# Patient Record
Sex: Female | Born: 1985 | Race: White | Hispanic: No | Marital: Married | Smoking: Never smoker
Health system: Southern US, Community
[De-identification: ages and names within clinical notes are randomized; demographics above are authoritative.]

## PROBLEM LIST (undated history)

## (undated) ENCOUNTER — Inpatient Hospital Stay (HOSPITAL_COMMUNITY): Payer: Self-pay

## (undated) DIAGNOSIS — F419 Anxiety disorder, unspecified: Secondary | ICD-10-CM

## (undated) DIAGNOSIS — F329 Major depressive disorder, single episode, unspecified: Secondary | ICD-10-CM

## (undated) DIAGNOSIS — H9192 Unspecified hearing loss, left ear: Secondary | ICD-10-CM

## (undated) DIAGNOSIS — I10 Essential (primary) hypertension: Secondary | ICD-10-CM

## (undated) DIAGNOSIS — M549 Dorsalgia, unspecified: Secondary | ICD-10-CM

## (undated) DIAGNOSIS — A630 Anogenital (venereal) warts: Secondary | ICD-10-CM

## (undated) DIAGNOSIS — R87629 Unspecified abnormal cytological findings in specimens from vagina: Secondary | ICD-10-CM

## (undated) DIAGNOSIS — F32A Depression, unspecified: Secondary | ICD-10-CM

---
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
## 2004-05-12 ENCOUNTER — Other Ambulatory Visit: Admission: RE | Admit: 2004-05-12 | Discharge: 2004-05-12 | Payer: Self-pay | Admitting: Obstetrics & Gynecology

## 2004-11-06 ENCOUNTER — Ambulatory Visit (HOSPITAL_BASED_OUTPATIENT_CLINIC_OR_DEPARTMENT_OTHER): Admission: RE | Admit: 2004-11-06 | Discharge: 2004-11-06 | Payer: Self-pay | Admitting: Plastic Surgery

## 2004-11-06 ENCOUNTER — Ambulatory Visit (HOSPITAL_COMMUNITY): Admission: RE | Admit: 2004-11-06 | Discharge: 2004-11-06 | Payer: Self-pay | Admitting: Plastic Surgery

## 2006-07-13 ENCOUNTER — Inpatient Hospital Stay (HOSPITAL_COMMUNITY): Admission: AD | Admit: 2006-07-13 | Discharge: 2006-07-17 | Payer: Self-pay | Admitting: Obstetrics & Gynecology

## 2010-10-06 ENCOUNTER — Other Ambulatory Visit: Payer: Self-pay | Admitting: Obstetrics and Gynecology

## 2011-09-20 ENCOUNTER — Ambulatory Visit (HOSPITAL_COMMUNITY)
Admission: RE | Admit: 2011-09-20 | Discharge: 2011-09-20 | Disposition: A | Discharge status: Home / Self Care | Payer: Managed Care, Other (non HMO) | Source: Ambulatory Visit | Attending: Family Medicine | Admitting: Family Medicine

## 2011-09-20 ENCOUNTER — Other Ambulatory Visit: Payer: Self-pay | Admitting: Family Medicine

## 2011-09-20 DIAGNOSIS — M549 Dorsalgia, unspecified: Secondary | ICD-10-CM

## 2011-09-20 DIAGNOSIS — M546 Pain in thoracic spine: Secondary | ICD-10-CM | POA: Insufficient documentation

## 2011-09-20 DIAGNOSIS — M542 Cervicalgia: Secondary | ICD-10-CM | POA: Insufficient documentation

## 2011-10-12 ENCOUNTER — Encounter (HOSPITAL_BASED_OUTPATIENT_CLINIC_OR_DEPARTMENT_OTHER): Payer: Self-pay | Admitting: *Deleted

## 2011-10-12 ENCOUNTER — Emergency Department (INDEPENDENT_AMBULATORY_CARE_PROVIDER_SITE_OTHER): Payer: Worker's Compensation

## 2011-10-12 ENCOUNTER — Emergency Department (HOSPITAL_BASED_OUTPATIENT_CLINIC_OR_DEPARTMENT_OTHER)
Admission: EM | Admit: 2011-10-12 | Discharge: 2011-10-12 | Disposition: A | Discharge status: Home / Self Care | Payer: Worker's Compensation | Attending: Emergency Medicine | Admitting: Emergency Medicine

## 2011-10-12 DIAGNOSIS — W3189XA Contact with other specified machinery, initial encounter: Secondary | ICD-10-CM | POA: Insufficient documentation

## 2011-10-12 DIAGNOSIS — S59909A Unspecified injury of unspecified elbow, initial encounter: Secondary | ICD-10-CM

## 2011-10-12 DIAGNOSIS — M25539 Pain in unspecified wrist: Secondary | ICD-10-CM

## 2011-10-12 DIAGNOSIS — S5780XA Crushing injury of unspecified forearm, initial encounter: Secondary | ICD-10-CM | POA: Insufficient documentation

## 2011-10-12 DIAGNOSIS — M79609 Pain in unspecified limb: Secondary | ICD-10-CM

## 2011-10-12 DIAGNOSIS — Q851 Tuberous sclerosis: Secondary | ICD-10-CM | POA: Insufficient documentation

## 2011-10-12 DIAGNOSIS — F17200 Nicotine dependence, unspecified, uncomplicated: Secondary | ICD-10-CM | POA: Insufficient documentation

## 2011-10-12 DIAGNOSIS — X58XXXA Exposure to other specified factors, initial encounter: Secondary | ICD-10-CM

## 2011-10-12 DIAGNOSIS — W230XXA Caught, crushed, jammed, or pinched between moving objects, initial encounter: Secondary | ICD-10-CM

## 2011-10-12 DIAGNOSIS — S6990XA Unspecified injury of unspecified wrist, hand and finger(s), initial encounter: Secondary | ICD-10-CM

## 2011-10-12 DIAGNOSIS — Y9289 Other specified places as the place of occurrence of the external cause: Secondary | ICD-10-CM | POA: Insufficient documentation

## 2011-10-12 DIAGNOSIS — Y990 Civilian activity done for income or pay: Secondary | ICD-10-CM | POA: Insufficient documentation

## 2011-10-12 HISTORY — DX: Dorsalgia, unspecified: M54.9

## 2011-10-12 MED ORDER — TRAMADOL HCL 50 MG PO TABS: 50.0000 mg | ORAL_TABLET | Freq: Four times a day (QID) | ORAL | Status: AC | PRN

## 2011-10-12 NOTE — ED Notes (Signed)
Pt reports her right arm got caught in packing machine at work and "started to be crushed"- pulse present, cap refill <3 sec

## 2011-10-12 NOTE — Discharge Instructions (Signed)
Crush Injury, Fingers or Toes  A crush injury to the fingers or toes means the tissues have been damaged by being squeezed (compressed). There will be bleeding into the tissues and swelling. Often, blood will collect under the skin. When this happens, the skin on the finger often dies and may slough off (shed) 1 week to 10 days later. Usually, new skin is growing underneath. If the injury has been too severe and the tissue does not survive, the damaged tissue may begin to turn black over several days.   Wounds which occur because of the crushing may be stitched (sutured) shut. However, crush injuries are more likely to become infected than other injuries.These wounds may not be closed as tightly as other types of cuts to prevent infection. Nails involved are often lost. These usually grow back over several weeks.   DIAGNOSIS  X-rays may be taken to see if there is any injury to the bones.  TREATMENT  Broken bones (fractures) may be treated with splinting, depending on the fracture. Often, no treatment is required for fractures of the last bone in the fingers or toes.  HOME CARE INSTRUCTIONS    The crushed part should be raised (elevated) above the heart or center of the chest as much as possible for the first several days or as directed. This helps with pain and lessens swelling. Less swelling increases the chances that the crushed part will survive.   Put ice on the injured area.   Put ice in a plastic bag.   Place a towel between your skin and the bag.   Leave the ice on for 15 to 20 minutes, 3 to 4 times a day for the first 2 days.   Only take over-the-counter or prescription medicines for pain, discomfort, or fever as directed by your caregiver.   Use your injured part only as directed.   Change your bandages (dressings) as directed.   Keep all follow-up appointments as directed by your caregiver. Not keeping your appointment could result in a chronic or permanent injury, pain, and disability. If there  is any problem keeping the appointment, you must call to reschedule.  SEEK IMMEDIATE MEDICAL CARE IF:    There is redness, swelling, or increasing pain in the wound area.   Pus is coming from the wound.   You have a fever.   You notice a bad smell coming from the wound or dressing.   The edges of the wound do not stay together after the sutures have been removed.   You are unable to move the injured finger or toe.  MAKE SURE YOU:    Understand these instructions.   Will watch your condition.   Will get help right away if you are not doing well or get worse.  Document Released: 05/17/2005 Document Revised: 05/06/2011 Document Reviewed: 10/02/2010  [employer] Patient Information 2012 [employer].

## 2011-10-12 NOTE — ED Provider Notes (Signed)
History    This chart was scribed for Zoe Bertha Zoe Cords, MD, MD by Zoe Evans. The patient was seen in room MH01 and the patient's care was started at 12:48AM.   CSN: [id number]  Arrival date & time 10/12/11  0014   First MD Initiated Contact with Patient 10/12/11 0047      Chief Complaint  Patient presents with  . Arm Injury    (Consider location/radiation/quality/duration/timing/severity/associated sxs/prior treatment) Patient is a 26 y.o. female presenting with arm injury. The history is provided by the patient.  Arm Injury  The incident occurred today. The incident occurred at work. The injury mechanism was a crush injury. The injury was related to a product (machine partially cam down on right forearm). The wounds were not self-inflicted. No protective equipment was used. She came to the ER via personal transport. There is an injury to the right forearm. The pain is moderate. It is unlikely that a foreign body is present. Pertinent negatives include no focal weakness and no tingling. There have been no prior injuries to these areas. Her tetanus status is UTD. She has received no recent medical care.   Zoe Evans is a 26 y.o. female who presents to the Emergency Department complaining of moderate right arm injury onset today. Pt was at work and got her arm caught in packing machine. She states that machine "started crushing her arm." Her tetanus is UTD. She denies any other pain. Pain has been constant since onset without radiation.   Past Medical History  Diagnosis Date  . Back pain     Past Surgical History  Procedure Date  . Cesarean section     No family history on file.  History  Substance Use Topics  . Smoking status: Current Everyday Smoker  . Smokeless tobacco: Never Used  . Alcohol Use: Not on file    OB History    Grav Para Term Preterm Abortions TAB SAB Ect Mult Living                  Review of Systems  Neurological: Negative for tingling and  focal weakness.  All other systems reviewed and are negative.   10 Systems reviewed and all are negative for acute change except as noted in the HPI.   Allergies  Codeine  Home Medications   Current Outpatient Rx  Name Route Sig Dispense Refill  . CYCLOBENZAPRINE HCL 5 MG PO TABS Oral Take 5 mg by mouth 3 (three) times daily as needed.    Marland Kitchen HYDROCODONE-ACETAMINOPHEN 5-325 MG PO TABS Oral Take 1 tablet by mouth every 6 (six) hours as needed.      BP 132/94  Pulse 79  Temp(Src) 98.6 F (37 C) (Oral)  Resp 20  Ht 5\' 4"  (1.626 m)  Wt 160 lb (72.576 kg)  BMI 27.46 kg/m2  SpO2 100%  Physical Exam  Nursing note and vitals reviewed. Constitutional: She is oriented to person, place, and time. She appears well-developed and well-nourished. No distress.  HENT:  Head: Normocephalic and atraumatic.  Eyes: EOM are normal.  Cardiovascular: Intact distal pulses.        Cap refill less than 2 sec to all digits  Musculoskeletal: Normal range of motion.       denuded area 1.25 cm in wrist   Neurological: She is alert and oriented to person, place, and time. She has normal reflexes.  Skin: Skin is warm and dry.  Psychiatric: She has a normal mood and affect. Her  behavior is normal.    ED Course  Procedures (including critical care time) DIAGNOSTIC STUDIES: Oxygen Saturation is 100% on room air, normal by my interpretation.    COORDINATION OF CARE: 12:50PM EDP discusses pt ED treatment with pt. Right Wrist and forearm xray   Labs Reviewed - No data to display No results found.   No diagnosis found.    MDM  Wound care with neosporin, keep clean and dry.  follow up with hand surgeon within 2 days return for redness streaking drainage worsening swelling of the extremity discoloration.  Loss of sensation or any concerns.  Patient verbalizes understanding and agrees to follow up      Zoe Debarr Zoe Cords, MD 10/12/11 0401

## 2012-02-03 ENCOUNTER — Emergency Department (HOSPITAL_BASED_OUTPATIENT_CLINIC_OR_DEPARTMENT_OTHER)
Admission: EM | Admit: 2012-02-03 | Discharge: 2012-02-03 | Disposition: A | Discharge status: Home / Self Care | Payer: Worker's Compensation | Attending: Emergency Medicine | Admitting: Emergency Medicine

## 2012-02-03 ENCOUNTER — Encounter (HOSPITAL_BASED_OUTPATIENT_CLINIC_OR_DEPARTMENT_OTHER): Payer: Self-pay

## 2012-02-03 DIAGNOSIS — Z886 Allergy status to analgesic agent status: Secondary | ICD-10-CM | POA: Insufficient documentation

## 2012-02-03 DIAGNOSIS — F17200 Nicotine dependence, unspecified, uncomplicated: Secondary | ICD-10-CM | POA: Insufficient documentation

## 2012-02-03 DIAGNOSIS — S39012A Strain of muscle, fascia and tendon of lower back, initial encounter: Secondary | ICD-10-CM

## 2012-02-03 DIAGNOSIS — S239XXA Sprain of unspecified parts of thorax, initial encounter: Secondary | ICD-10-CM | POA: Insufficient documentation

## 2012-02-03 DIAGNOSIS — X503XXA Overexertion from repetitive movements, initial encounter: Secondary | ICD-10-CM | POA: Insufficient documentation

## 2012-02-03 MED ORDER — TRAMADOL HCL 50 MG PO TABS: 50.0000 mg | ORAL_TABLET | Freq: Four times a day (QID) | ORAL | Status: AC | PRN

## 2012-02-03 NOTE — ED Provider Notes (Signed)
History     CSN: [id number]  Arrival date & time 02/03/12  0350   First MD Initiated Contact with Patient 02/03/12 0424      Chief Complaint  Patient presents with  . back and right arm pain     (Consider location/radiation/quality/duration/timing/severity/associated sxs/prior treatment) HPI Comments: Patient noticed that she was having pain in her upper back after lifting heavy objects at work.  There was no trauma or fall.  She says her right arm is painful as well.  There is no weakness and no numbness.  She denies chest pain or shortness of breath.  The history is provided by the patient.    Past Medical History  Diagnosis Date  . Back pain     Past Surgical History  Procedure Date  . Cesarean section     No family history on file.  History  Substance Use Topics  . Smoking status: Current Everyday Smoker -- 0.5 packs/day  . Smokeless tobacco: Never Used  . Alcohol Use: 0.6 oz/week    1 Glasses of wine per week     occasional    OB History    Grav Para Term Preterm Abortions TAB SAB Ect Mult Living                  Review of Systems  All other systems reviewed and are negative.    Allergies  Codeine  Home Medications  No current outpatient prescriptions on file.  BP 127/80  Pulse 78  Temp 98 F (36.7 C) (Oral)  Resp 18  SpO2 100%  Physical Exam  Nursing note and vitals reviewed. Constitutional: She is oriented to person, place, and time. She appears well-developed and well-nourished.  HENT:  Head: Normocephalic and atraumatic.  Neck: Normal range of motion. Neck supple.  Cardiovascular: Normal rate and regular rhythm.   Pulmonary/Chest: Effort normal and breath sounds normal. No respiratory distress.  Musculoskeletal:       The right arm appears grossly normal.  There is ttp in the right upper back.  Strength is 5/5 in bue, ulnar and radial pulses are palpable and equal in the bue.    Neurological: She is alert and oriented to person,  place, and time.  Skin: Skin is warm and dry.    ED Course  Procedures (including critical care time)  Labs Reviewed - No data to display No results found.   No diagnosis found.    MDM  Will treat as back strain with nsaids, tramadol.        Geoffery Lyons, MD 02/03/12 [phone]

## 2012-02-03 NOTE — ED Notes (Signed)
Patient here from work after reporting that she lifted approximately 40lbs and now is experiencing general backpain and pain down right arm with any movement. No obvious trauma noted

## 2012-02-03 NOTE — ED Notes (Signed)
MD at bedside. 

## 2013-06-26 ENCOUNTER — Encounter: Payer: Self-pay | Admitting: Nurse Practitioner

## 2013-06-26 ENCOUNTER — Ambulatory Visit (INDEPENDENT_AMBULATORY_CARE_PROVIDER_SITE_OTHER): Payer: Medicaid Other | Admitting: Nurse Practitioner

## 2013-06-26 VITALS — BP 130/88 | Temp 98.4°F | Ht 63.0 in | Wt 176.1 lb

## 2013-06-26 DIAGNOSIS — F341 Dysthymic disorder: Secondary | ICD-10-CM

## 2013-06-26 DIAGNOSIS — F418 Other specified anxiety disorders: Secondary | ICD-10-CM

## 2013-06-26 DIAGNOSIS — R21 Rash and other nonspecific skin eruption: Secondary | ICD-10-CM

## 2013-06-26 MED ORDER — CITALOPRAM HYDROBROMIDE 20 MG PO TABS: 20.0000 mg | ORAL_TABLET | Freq: Every day | ORAL | Status: DC

## 2013-06-26 MED ORDER — CLOBETASOL PROPIONATE 0.05 % EX CREA: 1.0000 "application " | TOPICAL_CREAM | Freq: Two times a day (BID) | CUTANEOUS | Status: DC

## 2013-06-26 NOTE — Patient Instructions (Signed)
Loratadine 10 mg in the morning Benadryl 25 mg at night 

## 2013-07-01 ENCOUNTER — Encounter: Payer: Self-pay | Admitting: Nurse Practitioner

## 2013-07-01 DIAGNOSIS — F418 Other specified anxiety disorders: Secondary | ICD-10-CM | POA: Insufficient documentation

## 2013-07-01 NOTE — Assessment & Plan Note (Signed)
Restart Celexa as directed. Discussed importance of stress reduction and regular exercise. Recheck in 3 months.

## 2013-07-01 NOTE — Progress Notes (Signed)
Subjective:  Presents with complaints of a rash on the front of both legs the past 3 months. Limited to this area. No known contacts. Extremely pruritic to the point of scratching until she believes. Would like to restart her Celexa. Took this without difficulty, has been off of it for about a year. Has been under a lot more stress. Has decreased her exercise. Complaints of fatigue, emotional lability, sleep disturbance. No suicidal thoughts or ideation.  Objective:   BP 130/88  Temp(Src) 98.4 F (36.9 C) (Oral)  Ht 5\' 3"  (1.6 m)  Wt 176 lb 2 oz (79.89 kg)  BMI 31.21 kg/m2 NAD. Alert, oriented. Lungs clear. Heart regular rate rhythm. Very faint pink fine papular lesions noted on both pretibial areas. No open areas. No other rash.  Assessment: Depression with anxiety  Rash and nonspecific skin eruption  Plan: Meds ordered this encounter  Medications  . clobetasol cream (TEMOVATE) 0.05 %    Sig: Apply 1 application topically 2 (two) times daily. Prn rash up to 2 weeks    Dispense:  45 g    Refill:  0    Order Specific Question:  Supervising Provider    Answer:  Merlyn AlbertLUKING, WILLIAM S [[id number]]  . citalopram (CELEXA) 20 MG tablet    Sig: Take 1 tablet (20 mg total) by mouth daily.    Dispense:  30 tablet    Refill:  5    Order Specific Question:  Supervising Provider    Answer:  Merlyn AlbertLUKING, WILLIAM S [[id number]]   antihistamines as directed for itching. Discussed importance of stress reduction and regular exercise. Recheck in 3 months, call back sooner if rash persists.

## 2013-10-15 ENCOUNTER — Encounter: Payer: Self-pay | Admitting: Family Medicine

## 2013-10-15 ENCOUNTER — Ambulatory Visit (INDEPENDENT_AMBULATORY_CARE_PROVIDER_SITE_OTHER): Payer: Medicaid Other | Admitting: Family Medicine

## 2013-10-15 VITALS — BP 118/74 | Temp 98.3°F | Ht 63.0 in | Wt 168.0 lb

## 2013-10-15 DIAGNOSIS — J310 Chronic rhinitis: Secondary | ICD-10-CM

## 2013-10-15 DIAGNOSIS — J329 Chronic sinusitis, unspecified: Secondary | ICD-10-CM

## 2013-10-15 MED ORDER — CEFPROZIL 500 MG PO TABS: 500.0000 mg | ORAL_TABLET | Freq: Two times a day (BID) | ORAL | Status: DC

## 2013-10-15 NOTE — Progress Notes (Signed)
   Subjective:    Patient ID: Zoe Evans, female    DOB: 03/06/1986, 28 y.o.   MRN: [id number]  Sinusitis This is a new problem. The current episode started in the past 7 days. There has been no fever. Associated symptoms include congestion, coughing, headaches, sinus pressure and a sore throat. Past treatments include acetaminophen and oral decongestants. The treatment provided no relief.   Hit about ten d ago,swelling in the face  Nasal cong and dissch  Cough prod at times  Ears hurting   nor fever  fa had sinusitis   Lot a cough a titimes, no hx wheezing  Review of Systems  HENT: Positive for congestion, sinus pressure and sore throat.   Respiratory: Positive for cough.   Neurological: Positive for headaches.       Objective:   Physical Exam  Alert mild malaise. Frontal tenderness. Trace erythematous . Lungs clear heart regular in rhythm.      Assessment & Plan:  Impression acute rhinosinusitis plan antibiotics prescribed. Encouraged to stop smoking. Symptomatic care discussed. 1 signs discussed. WSL

## 2013-11-02 ENCOUNTER — Telehealth: Payer: Self-pay | Admitting: Family Medicine

## 2013-11-02 MED ORDER — AMOXICILLIN-POT CLAVULANATE 875-125 MG PO TABS: 1.0000 | ORAL_TABLET | Freq: Two times a day (BID) | ORAL | Status: AC

## 2013-11-02 MED ORDER — FLUCONAZOLE 150 MG PO TABS: ORAL_TABLET | ORAL | Status: DC

## 2013-11-02 NOTE — Telephone Encounter (Signed)
difl 150 two one po thr d apart

## 2013-11-02 NOTE — Telephone Encounter (Signed)
Patient was just seen on 10/15/2013 for rhinosinusitis. She is still having a lot of pressure in her nasal area and a lot of drainage. She also has a really bad cough and a sore throat from the drainage. Can we try her on something else?  [facility]

## 2013-11-02 NOTE — Telephone Encounter (Signed)
Medication sent to pharmacy. Patient was notified.  

## 2013-11-02 NOTE — Telephone Encounter (Signed)
Left message on voicemail notifying patient that med was sent to pharmacy.  

## 2013-11-02 NOTE — Telephone Encounter (Signed)
Patient would like something for a yeast infection called in to [facility].

## 2013-11-02 NOTE — Telephone Encounter (Signed)
Aug 875 bid ten d 

## 2013-11-02 NOTE — Telephone Encounter (Signed)
Patient seen on 10/15/13 and prescribed Cefzil 500 mg BID 10 days

## 2013-12-10 ENCOUNTER — Encounter: Payer: Self-pay | Admitting: Family Medicine

## 2013-12-10 ENCOUNTER — Encounter: Payer: Self-pay | Admitting: Nurse Practitioner

## 2013-12-10 ENCOUNTER — Ambulatory Visit (INDEPENDENT_AMBULATORY_CARE_PROVIDER_SITE_OTHER): Payer: Medicaid Other | Admitting: Nurse Practitioner

## 2013-12-10 VITALS — BP 112/72 | Temp 98.4°F | Ht 63.0 in | Wt 168.1 lb

## 2013-12-10 DIAGNOSIS — S90862A Insect bite (nonvenomous), left foot, initial encounter: Secondary | ICD-10-CM

## 2013-12-10 DIAGNOSIS — IMO0002 Reserved for concepts with insufficient information to code with codable children: Secondary | ICD-10-CM

## 2013-12-10 DIAGNOSIS — W57XXXA Bitten or stung by nonvenomous insect and other nonvenomous arthropods, initial encounter: Secondary | ICD-10-CM

## 2013-12-10 MED ORDER — DOXYCYCLINE HYCLATE 100 MG PO TABS: 100.0000 mg | ORAL_TABLET | Freq: Two times a day (BID) | ORAL | Status: DC

## 2013-12-10 MED ORDER — CLOBETASOL PROPIONATE 0.05 % EX CREA: 1.0000 "application " | TOPICAL_CREAM | Freq: Two times a day (BID) | CUTANEOUS | Status: DC

## 2013-12-10 NOTE — Patient Instructions (Signed)
Loratadine 10 mg in the morning Benadryl 25 mg at night 

## 2013-12-12 ENCOUNTER — Encounter: Payer: Self-pay | Admitting: Nurse Practitioner

## 2013-12-12 NOTE — Progress Notes (Signed)
Subjective:  Presents for c/o a tick bite between toes on left foot that began 2 days ago. Localized mild edema, itching and tenderness. No fever. No headache. No other rash.   Objective:   BP 112/72  Temp(Src) 98.4 F (36.9 C) (Oral)  Ht 5\' 3"  (1.6 m)  Wt 168 lb 2 oz (76.261 kg)  BMI 29.79 kg/m2 NAD. Alert, oriented. Small raised pink papules between toes on left foot. Mild edema and tenderness. Strong DP pulse, toes warm with good cap refill.  Assessment: Tick bite of left foot, initial encounter   Plan:  Meds ordered this encounter  Medications  . doxycycline (VIBRA-TABS) 100 MG tablet    Sig: Take 1 tablet (100 mg total) by mouth 2 (two) times daily.    Dispense:  14 tablet    Refill:  0    Order Specific Question:  Supervising Provider    Answer:  Merlyn AlbertLUKING, WILLIAM S [[id number]]  . clobetasol cream (TEMOVATE) 0.05 %    Sig: Apply 1 application topically 2 (two) times daily.    Dispense:  30 g    Refill:  0    Order Specific Question:  Supervising Provider    Answer:  Merlyn AlbertLUKING, WILLIAM S [[id number]]   Reviewed signs of tick fever and infection. OTC antihistamine as directed. Return if symptoms worsen or fail to improve.

## 2013-12-18 ENCOUNTER — Other Ambulatory Visit: Payer: Self-pay | Admitting: Obstetrics and Gynecology

## 2013-12-19 LAB — CYTOLOGY - PAP

## 2014-01-21 ENCOUNTER — Ambulatory Visit: Payer: Medicaid Other | Admitting: Nurse Practitioner

## 2014-06-13 ENCOUNTER — Ambulatory Visit: Payer: Self-pay

## 2014-06-13 ENCOUNTER — Other Ambulatory Visit: Payer: Self-pay | Admitting: Occupational Medicine

## 2014-06-13 DIAGNOSIS — M25571 Pain in right ankle and joints of right foot: Secondary | ICD-10-CM

## 2014-07-24 ENCOUNTER — Encounter: Payer: Self-pay | Admitting: Family Medicine

## 2014-07-24 ENCOUNTER — Ambulatory Visit (INDEPENDENT_AMBULATORY_CARE_PROVIDER_SITE_OTHER): Payer: Self-pay | Admitting: Family Medicine

## 2014-07-24 VITALS — BP 138/98 | Ht 63.0 in | Wt 182.0 lb

## 2014-07-24 DIAGNOSIS — H8110 Benign paroxysmal vertigo, unspecified ear: Secondary | ICD-10-CM

## 2014-07-24 DIAGNOSIS — J329 Chronic sinusitis, unspecified: Secondary | ICD-10-CM

## 2014-07-24 DIAGNOSIS — J310 Chronic rhinitis: Secondary | ICD-10-CM

## 2014-07-24 DIAGNOSIS — F418 Other specified anxiety disorders: Secondary | ICD-10-CM

## 2014-07-24 MED ORDER — CITALOPRAM HYDROBROMIDE 20 MG PO TABS: 20.0000 mg | ORAL_TABLET | Freq: Every day | ORAL | Status: DC

## 2014-07-24 MED ORDER — AMOXICILLIN-POT CLAVULANATE 875-125 MG PO TABS: 1.0000 | ORAL_TABLET | Freq: Two times a day (BID) | ORAL | Status: DC

## 2014-07-24 MED ORDER — MECLIZINE HCL 25 MG PO TABS: 25.0000 mg | ORAL_TABLET | Freq: Three times a day (TID) | ORAL | Status: DC | PRN

## 2014-07-24 NOTE — Progress Notes (Signed)
   Subjective:    Patient ID: Zoe Evans, female    DOB: 10/20/1985, 29 y.o.   MRN: [id number]  HPI Patient is here today for a med check.  She needs a refill on her Celexa.takes it both for anxiety and depression. Mom's side has more trouble with thius states overall Celexa definitely helping  Pt also c/o dizziness that comes and goes. She said she has sinus infections all the time, and she knows that it is related to that. She does currently have sinus pressure, nasal congestion, with a small amount of bloody tinged mucous. Eyes have trouble focusing and starts to spin. Nausea with it. 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 Lungs clear. Heart rare in rhythm. Neuro exam intact     Assessment & Plan:  Impression 1 rhinosinusitis discussed #2 intermittent vertigo discussed #3 depression stable #4 elevated blood pressure times just one family history positive plan diet exercise discussed. Celexa refilled. Antibiotics prescribed. Antivert use discussed. WSL

## 2014-08-09 ENCOUNTER — Ambulatory Visit (INDEPENDENT_AMBULATORY_CARE_PROVIDER_SITE_OTHER): Payer: Self-pay | Admitting: Family Medicine

## 2014-08-09 ENCOUNTER — Encounter: Payer: Self-pay | Admitting: Family Medicine

## 2014-08-09 VITALS — BP 124/86 | Temp 98.4°F | Ht 63.0 in | Wt 180.2 lb

## 2014-08-09 DIAGNOSIS — G43909 Migraine, unspecified, not intractable, without status migrainosus: Secondary | ICD-10-CM | POA: Insufficient documentation

## 2014-08-09 DIAGNOSIS — G43709 Chronic migraine without aura, not intractable, without status migrainosus: Secondary | ICD-10-CM

## 2014-08-09 MED ORDER — SUMATRIPTAN SUCCINATE 50 MG PO TABS: ORAL_TABLET | ORAL | Status: DC

## 2014-08-09 MED ORDER — HYDROCODONE-ACETAMINOPHEN 5-325 MG PO TABS: 1.0000 | ORAL_TABLET | Freq: Four times a day (QID) | ORAL | Status: DC | PRN

## 2014-08-09 MED ORDER — PROMETHAZINE HCL 25 MG PO TABS: 25.0000 mg | ORAL_TABLET | Freq: Four times a day (QID) | ORAL | Status: DC | PRN

## 2014-08-09 NOTE — Progress Notes (Signed)
   Subjective:    Patient ID: Zoe Evans, female    DOB: 05/01/1986, 29 y.o.   MRN: [id number]  Migraine  This is a new problem. The current episode started in the past 7 days. The problem occurs constantly. The problem has been unchanged. The pain radiates to the face, right neck, left neck, left shoulder and right shoulder. The quality of the pain is described as aching. The pain is at a severity of 10/10. The pain is moderate. Associated symptoms include muscle aches, nausea and vomiting. Associated symptoms comments: Fatigue . Nothing aggravates the symptoms. She has tried oral narcotics for the symptoms. The treatment provided moderate relief.   Patient states that she has no other concerns at this time  Headaches in general at least two times per wk  Clean houss no sig problem with this  infxn better  Right sided pain and tenderness, vomited  Now notes nexck pain  No merena present  Took pain meds with boyfriends hydrocodone  Has had more lately    .    Review of Systems  Gastrointestinal: Positive for nausea and vomiting.   no chest pain no abdominal pain no fever no chills     Objective:   Physical Exam Alert vitals stable. Lungs clear. Heart regular rhythm H&T normal neuro exam intact       Assessment & Plan:  Impression migraine headaches discussed plan Imitrex when necessary. Ibuprofen when necessary. Phenergan for nausea. Hydrocodone use sparingly no refill of USL

## 2015-01-13 ENCOUNTER — Telehealth: Payer: Self-pay | Admitting: Family Medicine

## 2015-01-13 DIAGNOSIS — Z30431 Encounter for routine checking of intrauterine contraceptive device: Secondary | ICD-10-CM

## 2015-01-13 NOTE — Telephone Encounter (Signed)
Patient needs referral to [facility] because she has Medicaid [employer].  She needs to see them because she has a Mirena and she feels like it is acting up.

## 2015-01-13 NOTE — Telephone Encounter (Signed)
Let's do 

## 2015-01-13 NOTE — Telephone Encounter (Signed)
Notified patient that referral has been initiated.

## 2015-03-05 ENCOUNTER — Telehealth: Payer: Self-pay | Admitting: Family Medicine

## 2015-03-05 ENCOUNTER — Other Ambulatory Visit: Payer: Self-pay | Admitting: *Deleted

## 2015-03-05 MED ORDER — CITALOPRAM HYDROBROMIDE 20 MG PO TABS: 20.0000 mg | ORAL_TABLET | Freq: Every day | ORAL | Status: DC

## 2015-03-05 NOTE — Telephone Encounter (Signed)
30 day supply sent to pharm. Pt needs office visit. Pt transferred to front to schedule.

## 2015-03-05 NOTE — Telephone Encounter (Signed)
Requesting Rx for citalopram (CELEXA) 20 MG tablet  [facility]

## 2015-03-17 ENCOUNTER — Other Ambulatory Visit: Payer: Self-pay | Admitting: Obstetrics and Gynecology

## 2015-03-18 LAB — CYTOLOGY - PAP

## 2015-04-04 ENCOUNTER — Encounter: Payer: Self-pay | Admitting: Nurse Practitioner

## 2015-04-04 ENCOUNTER — Ambulatory Visit (INDEPENDENT_AMBULATORY_CARE_PROVIDER_SITE_OTHER): Payer: Medicaid Other | Admitting: Nurse Practitioner

## 2015-04-04 VITALS — BP 130/84 | Ht 63.0 in | Wt 190.2 lb

## 2015-04-04 DIAGNOSIS — R5383 Other fatigue: Secondary | ICD-10-CM | POA: Diagnosis not present

## 2015-04-04 DIAGNOSIS — R350 Frequency of micturition: Secondary | ICD-10-CM | POA: Diagnosis not present

## 2015-04-04 DIAGNOSIS — J300 Vasomotor rhinitis: Secondary | ICD-10-CM | POA: Diagnosis not present

## 2015-04-04 DIAGNOSIS — F418 Other specified anxiety disorders: Secondary | ICD-10-CM

## 2015-04-04 DIAGNOSIS — R0683 Snoring: Secondary | ICD-10-CM

## 2015-04-04 LAB — POCT URINALYSIS DIPSTICK
Blood, UA: NEGATIVE
Spec Grav, UA: 1.02
pH, UA: 5

## 2015-04-04 LAB — POCT UA - MICROSCOPIC ONLY
BACTERIA, U MICROSCOPIC: NEGATIVE
RBC, URINE, MICROSCOPIC: NEGATIVE
WBC, Ur, HPF, POC: NEGATIVE

## 2015-04-04 LAB — POCT GLYCOSYLATED HEMOGLOBIN (HGB A1C): HEMOGLOBIN A1C: 5.3

## 2015-04-04 MED ORDER — CITALOPRAM HYDROBROMIDE 20 MG PO TABS: 20.0000 mg | ORAL_TABLET | Freq: Every day | ORAL | Status: DC

## 2015-04-04 NOTE — Patient Instructions (Signed)
Allegra or claritin nasacort AQ as directed

## 2015-04-04 NOTE — Progress Notes (Signed)
Subjective:  Presents for recheck on her depression. Has been on Celexa long-term, has been working well. Has noticed extreme fatigue particularly in the afternoons, worried about falling asleep while driving. Snoring. Unsure about any breathing problems while sleeping. States she sleeps all night at least 6-8 hours. Fatigue. Also complaints of generalized sinus pressure that she is noticed since the season changed. No fever. Minimal cough. Ear pressure. No sore throat. Some relief with Advil or Tylenol sinus medicine. Has also noted urinary frequency can occur day or night sometimes urinating up to 4 times in our. Has been going on since early September. Has seen her gynecologist, no problems noted that would contribute to this. Was on antibiotic at one point for possible UTI but this did not make a difference. No incontinence.  Objective:   BP 130/84 mmHg  Ht 5\' 3"  (1.6 m)  Wt 190 lb 4 oz (86.297 kg)  BMI 33.71 kg/m2 NAD. Alert, oriented. TMs very retracted, no erythema. Pharynx clear. Neck supple with mild soft anterior adenopathy. Lungs clear. Heart regular rate rhythm. Abdomen soft nondistended nontender. Results for orders placed or performed in visit on 04/04/15  POCT urinalysis dipstick  Result Value Ref Range   Color, UA     Clarity, UA     Glucose, UA     Bilirubin, UA     Ketones, UA     Spec Grav, UA 1.020    Blood, UA negative    pH, UA 5.0    Protein, UA     Urobilinogen, UA     Nitrite, UA     Leukocytes, UA moderate (2+) (A) Negative  POCT HgB A1C  Result Value Ref Range   Hemoglobin A1C 5.3   POCT UA - Microscopic Only  Result Value Ref Range   WBC, Ur, HPF, POC neg    RBC, urine, microscopic neg    Bacteria, U Microscopic neg    Mucus, UA     Epithelial cells, urine per micros rare    Crystals, Ur, HPF, POC     Casts, Ur, LPF, POC     Yeast, UA       Assessment:  Problem List Items Addressed This Visit      Other   Depression with anxiety - Primary     Other Visit Diagnoses    Vasomotor rhinitis        Urinary frequency        Relevant Orders    POCT urinalysis dipstick (Completed)    POCT HgB A1C (Completed)    Urine culture    Other fatigue        Snoring          Plan:  Meds ordered this encounter  Medications  . citalopram (CELEXA) 20 MG tablet    Sig: Take 1 tablet (20 mg total) by mouth daily.    Dispense:  90 tablet    Refill:  1    Order Specific Question:  Supervising Provider    Answer:  Riccardo DubinLUKING, WILLIAM S [[id number]]  Allegra or claritin nasacort AQ as directed Limit use of pseudoephedrine since this may be adding to her urinary problems. Will refer for sleep study for possible sleep apnea. Patient to use caution when driving. Urine culture pending. Return in about 6 months (around 10/02/2015) for recheck.

## 2015-04-06 LAB — URINE CULTURE

## 2015-04-10 ENCOUNTER — Encounter: Payer: Self-pay | Admitting: Family Medicine

## 2015-04-14 ENCOUNTER — Other Ambulatory Visit (HOSPITAL_COMMUNITY): Payer: Self-pay | Admitting: Respiratory Therapy

## 2015-04-14 DIAGNOSIS — G4730 Sleep apnea, unspecified: Secondary | ICD-10-CM

## 2015-04-14 DIAGNOSIS — R0683 Snoring: Secondary | ICD-10-CM

## 2015-05-28 ENCOUNTER — Encounter: Payer: Self-pay | Admitting: Nurse Practitioner

## 2015-05-28 ENCOUNTER — Ambulatory Visit (INDEPENDENT_AMBULATORY_CARE_PROVIDER_SITE_OTHER): Payer: Medicaid Other | Admitting: Nurse Practitioner

## 2015-05-28 VITALS — BP 126/82 | Temp 98.4°F | Ht 63.0 in | Wt 196.4 lb

## 2015-05-28 DIAGNOSIS — J209 Acute bronchitis, unspecified: Secondary | ICD-10-CM

## 2015-05-28 DIAGNOSIS — J0100 Acute maxillary sinusitis, unspecified: Secondary | ICD-10-CM | POA: Diagnosis not present

## 2015-05-28 DIAGNOSIS — R197 Diarrhea, unspecified: Secondary | ICD-10-CM | POA: Diagnosis not present

## 2015-05-28 MED ORDER — AMOXICILLIN 500 MG PO CAPS: 500.0000 mg | ORAL_CAPSULE | Freq: Three times a day (TID) | ORAL | Status: DC

## 2015-05-28 NOTE — Patient Instructions (Signed)
2 cups of Activia yogurt Align

## 2015-06-02 ENCOUNTER — Encounter: Payer: Self-pay | Admitting: Nurse Practitioner

## 2015-06-02 NOTE — Progress Notes (Signed)
Subjective:  Presents for c/o cough and congestion x 7 d. Fever and sore throat resolved. Maxillary area headache. Runny nose. Frequent non productive cough, worse at night. Possible slight wheeze. Ear pain. No vomiting or abdominal pain. Slight diarrhea that began yesterday. Taking fluids well. Voiding normal limit.  Objective:   BP 126/82 mmHg  Temp(Src) 98.4 F (36.9 C) (Oral)  Ht 5\' 3"  (1.6 m)  Wt 196 lb 6 oz (89.075 kg)  BMI 34.79 kg/m2 NAD. Alert, oriented. TMs mild clear effusion, no erythema. Pharynx nonerythematous with PND noted. Neck supple with mild soft anterior adenopathy. Lungs scattered faint expiratory crackles more anterior, no wheezing or tachypnea. Occasional bronchitic cough noted. Heart regular rate rhythm. Abdomen soft nontender.  Assessment: Acute maxillary sinusitis, recurrence not specified  Acute bronchitis, unspecified organism  Diarrhea, unspecified type  Plan:  Meds ordered this encounter  Medications  . amoxicillin (AMOXIL) 500 MG capsule    Sig: Take 1 capsule (500 mg total) by mouth 3 (three) times daily.    Dispense:  30 capsule    Refill:  0    Order Specific Question:  Supervising Provider    Answer:  Merlyn AlbertLUKING, WILLIAM S [[id number]]   OTC meds as directed for congestion and cough. Call back if symptoms worsen or persist.

## 2015-10-14 ENCOUNTER — Ambulatory Visit (INDEPENDENT_AMBULATORY_CARE_PROVIDER_SITE_OTHER): Payer: Medicaid Other | Admitting: Nurse Practitioner

## 2015-10-14 ENCOUNTER — Encounter: Payer: Self-pay | Admitting: Nurse Practitioner

## 2015-10-14 VITALS — BP 124/86 | Ht 63.0 in | Wt 185.5 lb

## 2015-10-14 DIAGNOSIS — F418 Other specified anxiety disorders: Secondary | ICD-10-CM | POA: Diagnosis not present

## 2015-10-14 DIAGNOSIS — M7661 Achilles tendinitis, right leg: Secondary | ICD-10-CM

## 2015-10-14 MED ORDER — CITALOPRAM HYDROBROMIDE 20 MG PO TABS: 20.0000 mg | ORAL_TABLET | Freq: Every day | ORAL | Status: DC

## 2015-10-14 NOTE — Progress Notes (Signed)
Subjective:  Presents for routine follow-up. Depression and anxiety stable on Celexa. Doing well with her diet. Went to the gym on a regular basis doing at least 30-45 minutes of cardio. Larey SeatFell on April 11, has had some mild right ankle discomfort since then. Has still been able to do her normal activities. Discomfort mainly in the posterior ankle area.  Objective:   BP 124/86 mmHg  Ht 5\' 3"  (1.6 m)  Wt 185 lb 8 oz (84.142 kg)  BMI 32.87 kg/m2 NAD. Alert, oriented. Lungs clear. Heart regular rhythm. Right foot strong pulses. Normal ROM with minimal tenderness. No joint laxity or crepitus. Gait normal limit. Distinct tenderness noted along the Achilles tendon.  Assessment:  Problem List Items Addressed This Visit      Other   Depression with anxiety - Primary    Other Visit Diagnoses    Achilles tendinitis of right lower extremity          Plan:  Meds ordered this encounter  Medications  . citalopram (CELEXA) 20 MG tablet    Sig: Take 1 tablet (20 mg total) by mouth daily.    Dispense:  90 tablet    Refill:  1    Order Specific Question:  Supervising Provider    Answer:  Merlyn AlbertLUKING, WILLIAM S [[id number]]   Neoprene ankle support. OTC anti-inflammatories as directed. Given copy of ankle exercises. Call back if worsens or persists. Return in about 6 months (around 04/15/2016) for recheck.

## 2015-10-14 NOTE — Patient Instructions (Signed)

## 2015-10-29 ENCOUNTER — Ambulatory Visit (INDEPENDENT_AMBULATORY_CARE_PROVIDER_SITE_OTHER): Payer: Medicaid Other | Admitting: Family Medicine

## 2015-10-29 ENCOUNTER — Encounter: Payer: Self-pay | Admitting: Family Medicine

## 2015-10-29 VITALS — BP 128/86 | Temp 98.4°F | Ht 63.0 in | Wt 185.0 lb

## 2015-10-29 DIAGNOSIS — M940 Chondrocostal junction syndrome [Tietze]: Secondary | ICD-10-CM

## 2015-10-29 MED ORDER — NAPROXEN 500 MG PO TABS: 500.0000 mg | ORAL_TABLET | Freq: Two times a day (BID) | ORAL | Status: DC

## 2015-10-29 NOTE — Progress Notes (Signed)
   Subjective:    Patient ID: Zoe Evans, female    DOB: 08/01/1985, 30 y.o.   MRN: [id number]  HPI Patient arrives with c/o sharp pain under breasts-comes and goes since Monday.  Patient involved in exercise lately. His lifting weights. Also hitting the elliptical  Pain is sharp transient last a few seconds. Primarily at the costal margin. Distal sternum. Next  No shortness breath no nausea.  Tried Pepto-Bismol Review of Systems No headache no chest pain no back pain ROS otherwise negative    Objective:   Physical Exam  Alert vitals stable. Lungs clear. Heart regular in rhythm. Costal margin/distal sternum quite tender to palpation, no deformity      Assessment & Plan:  Impression costochondritis plan anti-inflammatory medicine prescribed symptom care discussed warning signs discussed WSL

## 2015-11-24 ENCOUNTER — Telehealth: Payer: Self-pay | Admitting: Family Medicine

## 2015-11-24 DIAGNOSIS — Z139 Encounter for screening, unspecified: Secondary | ICD-10-CM

## 2015-11-24 NOTE — Telephone Encounter (Signed)
Pt is needing a varicella and hep b titer. Pt stated that the number [id number] hshab needs to be the one sent to [facility].

## 2015-11-24 NOTE — Telephone Encounter (Signed)
May we do titer for patient?

## 2015-11-24 NOTE — Telephone Encounter (Signed)
yes

## 2015-11-25 ENCOUNTER — Ambulatory Visit (INDEPENDENT_AMBULATORY_CARE_PROVIDER_SITE_OTHER): Payer: Medicaid Other

## 2015-11-25 DIAGNOSIS — Z111 Encounter for screening for respiratory tuberculosis: Secondary | ICD-10-CM | POA: Diagnosis not present

## 2015-11-25 NOTE — Telephone Encounter (Signed)
Done . Pt notified.  

## 2015-11-26 LAB — VARICELLA ZOSTER ANTIBODY, IGG: Varicella zoster IgG: 618 index (ref >165)

## 2015-11-26 LAB — HEPATITIS B SURFACE ANTIBODY,QUALITATIVE: Hep B Surface Ab, Qual: NONREACTIVE

## 2015-11-27 LAB — TB SKIN TEST
INDURATION: 0 mm
TB Skin Test: NEGATIVE

## 2015-12-16 ENCOUNTER — Encounter: Payer: Self-pay | Admitting: Nurse Practitioner

## 2015-12-16 ENCOUNTER — Ambulatory Visit (INDEPENDENT_AMBULATORY_CARE_PROVIDER_SITE_OTHER): Payer: Medicaid Other | Admitting: Nurse Practitioner

## 2015-12-16 VITALS — BP 118/72 | HR 68 | Temp 98.3°F | Resp 16 | Ht 64.0 in | Wt 181.0 lb

## 2015-12-16 DIAGNOSIS — Z79899 Other long term (current) drug therapy: Secondary | ICD-10-CM | POA: Diagnosis not present

## 2015-12-16 DIAGNOSIS — Z0000 Encounter for general adult medical examination without abnormal findings: Secondary | ICD-10-CM | POA: Diagnosis not present

## 2015-12-16 DIAGNOSIS — R350 Frequency of micturition: Secondary | ICD-10-CM

## 2015-12-16 DIAGNOSIS — Z111 Encounter for screening for respiratory tuberculosis: Secondary | ICD-10-CM | POA: Diagnosis not present

## 2015-12-16 DIAGNOSIS — Z13220 Encounter for screening for lipoid disorders: Secondary | ICD-10-CM | POA: Diagnosis not present

## 2015-12-16 DIAGNOSIS — Z01419 Encounter for gynecological examination (general) (routine) without abnormal findings: Secondary | ICD-10-CM

## 2015-12-16 DIAGNOSIS — R5383 Other fatigue: Secondary | ICD-10-CM

## 2015-12-16 LAB — POCT URINALYSIS DIPSTICK
SPEC GRAV UA: 1.015
pH, UA: 6

## 2015-12-17 LAB — HEPATIC FUNCTION PANEL
ALK PHOS: 51 IU/L (ref 39–117)
ALT: 18 IU/L (ref 0–32)
AST: 19 IU/L (ref 0–40)
Albumin: 4.8 g/dL (ref 3.5–5.5)
BILIRUBIN TOTAL: 0.4 mg/dL (ref 0.0–1.2)
BILIRUBIN, DIRECT: 0.09 mg/dL (ref 0.00–0.40)
Total Protein: 7 g/dL (ref 6.0–8.5)

## 2015-12-17 LAB — CBC WITH DIFFERENTIAL/PLATELET
BASOS: 1 %
Basophils Absolute: 0.1 10*3/uL (ref 0.0–0.2)
EOS (ABSOLUTE): 0.2 10*3/uL (ref 0.0–0.4)
EOS: 2 %
HEMATOCRIT: 42 % (ref 34.0–46.6)
HEMOGLOBIN: 13.9 g/dL (ref 11.1–15.9)
IMMATURE GRANULOCYTES: 0 %
Immature Grans (Abs): 0 10*3/uL (ref 0.0–0.1)
Lymphocytes Absolute: 2.4 10*3/uL (ref 0.7–3.1)
Lymphs: 34 %
MCH: 28.8 pg (ref 26.6–33.0)
MCHC: 33.1 g/dL (ref 31.5–35.7)
MCV: 87 fL (ref 79–97)
MONOCYTES: 8 %
MONOS ABS: 0.6 10*3/uL (ref 0.1–0.9)
NEUTROS PCT: 55 %
Neutrophils Absolute: 3.9 10*3/uL (ref 1.4–7.0)
Platelets: 344 10*3/uL (ref 150–379)
RBC: 4.82 x10E6/uL (ref 3.77–5.28)
RDW: 12.7 % (ref 12.3–15.4)
WBC: 7 10*3/uL (ref 3.4–10.8)

## 2015-12-17 LAB — BASIC METABOLIC PANEL
BUN / CREAT RATIO: 17 (ref 9–23)
BUN: 14 mg/dL (ref 6–20)
CALCIUM: 9.9 mg/dL (ref 8.7–10.2)
CHLORIDE: 99 mmol/L (ref 96–106)
CO2: 23 mmol/L (ref 18–29)
Creatinine, Ser: 0.81 mg/dL (ref 0.57–1.00)
GFR, EST AFRICAN AMERICAN: 113 mL/min/{1.73_m2} (ref >59)
GFR, EST NON AFRICAN AMERICAN: 98 mL/min/{1.73_m2} (ref >59)
Glucose: 84 mg/dL (ref 65–99)
POTASSIUM: 4.7 mmol/L (ref 3.5–5.2)
SODIUM: 138 mmol/L (ref 134–144)

## 2015-12-17 LAB — TSH: TSH: 1.25 u[IU]/mL (ref 0.450–4.500)

## 2015-12-17 LAB — LIPID PANEL
CHOLESTEROL TOTAL: 184 mg/dL (ref 100–199)
Chol/HDL Ratio: 3.2 ratio units (ref 0.0–4.4)
HDL: 58 mg/dL (ref >39)
LDL Calculated: 111 mg/dL — ABNORMAL HIGH (ref 0–99)
Triglycerides: 75 mg/dL (ref 0–149)
VLDL CHOLESTEROL CAL: 15 mg/dL (ref 5–40)

## 2015-12-17 LAB — VITAMIN D 25 HYDROXY (VIT D DEFICIENCY, FRACTURES): Vit D, 25-Hydroxy: 73.7 ng/mL (ref 30.0–100.0)

## 2015-12-18 ENCOUNTER — Encounter: Payer: Self-pay | Admitting: Nurse Practitioner

## 2015-12-18 LAB — TB SKIN TEST
Induration: 0 mm
TB Skin Test: NEGATIVE

## 2015-12-18 NOTE — Progress Notes (Signed)
   Subjective:    Patient ID: Zoe Evans, female    DOB: 12/13/1985, 30 y.o.   MRN: [id number]  HPI presents for her wellness exam. Overall healthy diet. Very active. Has Mirena for birth control, no bleeding. Same sexual partner. Has a strong family history of breast cancer see updated family history. Regular dental exams. Celexa does not seem to be working as well lately. Decreased pleasure in activities. Mild urinary frequency, no other urinary symptoms. Does drink a large amount of water.    Review of Systems  Constitutional: Negative for fever, activity change, appetite change and fatigue.  HENT: Negative for dental problem, ear pain, sinus pressure and sore throat.   Respiratory: Negative for cough, chest tightness, shortness of breath and wheezing.   Cardiovascular: Negative for chest pain.  Gastrointestinal: Negative for nausea, vomiting, abdominal pain, diarrhea, constipation and abdominal distention.  Genitourinary: Positive for frequency. Negative for dysuria, urgency, vaginal bleeding, vaginal discharge, enuresis, difficulty urinating, genital sores and pelvic pain.  Psychiatric/Behavioral: Positive for dysphoric mood. Negative for suicidal ideas.       Objective:   Physical Exam  Constitutional: She is oriented to person, place, and time. She appears well-developed. No distress.  HENT:  Right Ear: External ear normal.  Left Ear: External ear normal.  Mouth/Throat: Oropharynx is clear and moist.  Neck: Normal range of motion. Neck supple. No tracheal deviation present. No thyromegaly present.  Cardiovascular: Normal rate, regular rhythm and normal heart sounds.  Exam reveals no gallop.   No murmur heard. Pulmonary/Chest: Effort normal and breath sounds normal.  Abdominal: Soft. She exhibits no distension. There is no tenderness.  Genitourinary: Vagina normal and uterus normal. No vaginal discharge found.  External GU no rashes or lesions. Vagina no discharge. Cervix  normal limit in appearance. No CMT. Bimanual exam no tenderness or obvious masses. Had a normal Pap smear October 2016.  Musculoskeletal: She exhibits no edema.  Lymphadenopathy:    She has no cervical adenopathy.  Neurological: She is alert and oriented to person, place, and time.  Skin: Skin is warm and dry. No rash noted.  Psychiatric: She has a normal mood and affect. Her behavior is normal.  Vitals reviewed.  Breast exam: No masses noted; axillae no adenopathy. UA negative.        Assessment & Plan:  Well woman exam  Urinary frequency - Plan: POCT urinalysis dipstick  Other fatigue - Plan: CBC with Differential/Platelet, Basic metabolic panel, TSH, VITAMIN D 25 Hydroxy (Vit-D Deficiency, Fractures)  Screening cholesterol level - Plan: Lipid panel  High risk medication use - Plan: Hepatic function panel  Screening examination for pulmonary tuberculosis - Plan: TB Skin Test  Increase Celexa 20 mg to one and a half tabs by mouth daily. Go back to previous dosing if any problems. Call back in a few weeks if no improvement. Encouraged regular activity and healthy diet. Return in about 1 year (around 12/15/2016) for physical.

## 2016-01-05 ENCOUNTER — Other Ambulatory Visit: Payer: Self-pay | Admitting: *Deleted

## 2016-01-05 ENCOUNTER — Other Ambulatory Visit: Payer: Self-pay | Admitting: Nurse Practitioner

## 2016-01-05 ENCOUNTER — Telehealth: Payer: Self-pay | Admitting: Family Medicine

## 2016-01-05 DIAGNOSIS — Z139 Encounter for screening, unspecified: Secondary | ICD-10-CM

## 2016-01-05 MED ORDER — CITALOPRAM HYDROBROMIDE 20 MG PO TABS: ORAL_TABLET | ORAL | 1 refills | Status: DC

## 2016-01-05 NOTE — Telephone Encounter (Signed)
Zoe Evans,  1) Pt is needing a [facility]   2) pt was told to call back about her meds as well and he feels she needs to go to 30 mgs please on her citalopram   [facility]

## 2016-01-05 NOTE — Telephone Encounter (Signed)
Left message to return call 

## 2016-01-05 NOTE — Telephone Encounter (Signed)
I changed her Celexa dose and sent in new Rx. Please order hep b screening per her request and let her know.

## 2016-01-06 NOTE — Telephone Encounter (Signed)
Discussed with pt. New dose was sent to pharm and hep b titer ordered.

## 2016-01-06 NOTE — Telephone Encounter (Signed)
Left message to return call 

## 2016-01-07 LAB — HEPATITIS B SURFACE ANTIGEN: Hepatitis B Surface Ag: NEGATIVE

## 2016-01-08 LAB — SPECIMEN STATUS REPORT

## 2016-01-08 LAB — HEPATITIS B SURFACE ANTIBODY,QUALITATIVE: Hep B Surface Ab, Qual: REACTIVE

## 2016-02-06 ENCOUNTER — Ambulatory Visit (INDEPENDENT_AMBULATORY_CARE_PROVIDER_SITE_OTHER): Payer: Medicaid Other | Admitting: Family Medicine

## 2016-02-06 ENCOUNTER — Encounter: Payer: Self-pay | Admitting: Family Medicine

## 2016-02-06 VITALS — Temp 98.0°F | Ht 64.0 in | Wt 184.8 lb

## 2016-02-06 DIAGNOSIS — M542 Cervicalgia: Secondary | ICD-10-CM

## 2016-02-06 DIAGNOSIS — J019 Acute sinusitis, unspecified: Secondary | ICD-10-CM | POA: Diagnosis not present

## 2016-02-06 DIAGNOSIS — B9689 Other specified bacterial agents as the cause of diseases classified elsewhere: Secondary | ICD-10-CM

## 2016-02-06 MED ORDER — HYDROCODONE-ACETAMINOPHEN 5-325 MG PO TABS: 1.0000 | ORAL_TABLET | Freq: Four times a day (QID) | ORAL | 0 refills | Status: DC | PRN

## 2016-02-06 MED ORDER — CHLORZOXAZONE 500 MG PO TABS: 500.0000 mg | ORAL_TABLET | Freq: Four times a day (QID) | ORAL | 0 refills | Status: DC | PRN

## 2016-02-06 MED ORDER — AMOXICILLIN-POT CLAVULANATE 875-125 MG PO TABS: 1.0000 | ORAL_TABLET | Freq: Two times a day (BID) | ORAL | 0 refills | Status: DC

## 2016-02-06 NOTE — Progress Notes (Signed)
   Subjective:    Patient ID: Zoe Evans, female    DOB: 05/09/1986, 30 y.o.   MRN: [id number]  HPI  Patient arrives with c/o severe neck pain with movement-woke up like this this am. Patient also having sinus sx for a few weeks and started blowing out blood. She has been doing a lot of exercising recently she thinks that might a triggered the neck problem. She states it hurts to move that head left or right. She denies any other previous trouble with this. She also relates a lot of head congestion severe sinus pressure pain discomfort in the maxillary sinuses Review of Systems Neck pain and discomfort. Denies any radiation down the arms. No chest tightness pressure pain with this. Relates a lot of head congestion drainage sinus pressure. No fever or vomiting.    Objective:   Physical Exam Moderate sinus tenderness eardrums normal throat is normal lungs clear heart regular significant cervical spinal muscle tenderness that goes into the trapezius on both sides       Assessment & Plan:  Cervical spine strain-muscle relaxers pain medicine as necessary cautioned drowsiness mom states she's taken Vicodin before without having negative side effects the use ease medicines only home warm compresses gentle range of motion anti-inflammatories as necessary  Sinusitis antibiotics prescribed warning signs discussed follow-up if problems.

## 2016-02-24 ENCOUNTER — Encounter: Payer: Self-pay | Admitting: Family Medicine

## 2016-02-24 ENCOUNTER — Ambulatory Visit (INDEPENDENT_AMBULATORY_CARE_PROVIDER_SITE_OTHER): Payer: Medicaid Other | Admitting: Family Medicine

## 2016-02-24 VITALS — BP 142/94 | Ht 63.0 in | Wt 184.0 lb

## 2016-02-24 DIAGNOSIS — Z23 Encounter for immunization: Secondary | ICD-10-CM | POA: Diagnosis not present

## 2016-02-24 DIAGNOSIS — R030 Elevated blood-pressure reading, without diagnosis of hypertension: Secondary | ICD-10-CM

## 2016-02-24 MED ORDER — ENALAPRIL MALEATE 5 MG PO TABS: 5.0000 mg | ORAL_TABLET | Freq: Every day | ORAL | 5 refills | Status: DC

## 2016-02-24 NOTE — Patient Instructions (Signed)
Hypertension Hypertension, commonly called high blood pressure, is when the force of blood pumping through your arteries is too strong. Your arteries are the blood vessels that carry blood from your heart throughout your body. A blood pressure reading consists of a higher number over a lower number, such as 110/72. The higher number (systolic) is the pressure inside your arteries when your heart pumps. The lower number (diastolic) is the pressure inside your arteries when your heart relaxes. Ideally you want your blood pressure below 120/80. Hypertension forces your heart to work harder to pump blood. Your arteries may become narrow or stiff. Having untreated or uncontrolled hypertension can cause heart attack, stroke, kidney disease, and other problems. RISK FACTORS Some risk factors for high blood pressure are controllable. Others are not.  Risk factors you cannot control include:   Race. You may be at higher risk if you are African American.  Age. Risk increases with age.  Gender. Men are at higher risk than women before age 45 years. After age 65, women are at higher risk than men. Risk factors you can control include:  Not getting enough exercise or physical activity.  Being overweight.  Getting too much fat, sugar, calories, or salt in your diet.  Drinking too much alcohol. SIGNS AND SYMPTOMS Hypertension does not usually cause signs or symptoms. Extremely high blood pressure (hypertensive crisis) may cause headache, anxiety, shortness of breath, and nosebleed. DIAGNOSIS To check if you have hypertension, your health care provider will measure your blood pressure while you are seated, with your arm held at the level of your heart. It should be measured at least twice using the same arm. Certain conditions can cause a difference in blood pressure between your right and left arms. A blood pressure reading that is higher than normal on one occasion does not mean that you need treatment. If  it is not clear whether you have high blood pressure, you may be asked to return on a different day to have your blood pressure checked again. Or, you may be asked to monitor your blood pressure at home for 1 or more weeks. TREATMENT Treating high blood pressure includes making lifestyle changes and possibly taking medicine. Living a healthy lifestyle can help lower high blood pressure. You may need to change some of your habits. Lifestyle changes may include:  Following the DASH diet. This diet is high in fruits, vegetables, and whole grains. It is low in salt, red meat, and added sugars.  Keep your sodium intake below 2,300 mg per day.  Getting at least 30-45 minutes of aerobic exercise at least 4 times per week.  Losing weight if necessary.  Not smoking.  Limiting alcoholic beverages.  Learning ways to reduce stress. Your health care provider may prescribe medicine if lifestyle changes are not enough to get your blood pressure under control, and if one of the following is true:  You are 18-59 years of age and your systolic blood pressure is above 140.  You are 60 years of age or older, and your systolic blood pressure is above 150.  Your diastolic blood pressure is above 90.  You have diabetes, and your systolic blood pressure is over 140 or your diastolic blood pressure is over 90.  You have kidney disease and your blood pressure is above 140/90.  You have heart disease and your blood pressure is above 140/90. Your personal target blood pressure may vary depending on your medical conditions, your age, and other factors. HOME CARE INSTRUCTIONS    Have your blood pressure rechecked as directed by your health care provider.   Take medicines only as directed by your health care provider. Follow the directions carefully. Blood pressure medicines must be taken as prescribed. The medicine does not work as well when you skip doses. Skipping doses also puts you at risk for  problems.  Do not smoke.   Monitor your blood pressure at home as directed by your health care provider. SEEK MEDICAL CARE IF:   You think you are having a reaction to medicines taken.  You have recurrent headaches or feel dizzy.  You have swelling in your ankles.  You have trouble with your vision. SEEK IMMEDIATE MEDICAL CARE IF:  You develop a severe headache or confusion.  You have unusual weakness, numbness, or feel faint.  You have severe chest or abdominal pain.  You vomit repeatedly.  You have trouble breathing. MAKE SURE YOU:   Understand these instructions.  Will watch your condition.  Will get help right away if you are not doing well or get worse.   This information is not intended to replace advice given to you by your health care provider. Make sure you discuss any questions you have with your health care provider.   Document Released: 05/17/2005 Document Revised: 10/01/2014 Document Reviewed: 03/09/2013 Elsevier Interactive Patient Education 2016 Elsevier Inc.  

## 2016-02-24 NOTE — Progress Notes (Signed)
   Subjective:    Patient ID: Zoe Evans, female    DOB: 09/24/1985, 30 y.o.   MRN: [id number]  HPIevery time bp was checked at school it was high. Having headaches for the past 3 weeks. Sob when going up steps.   140  Over 102    Having headache and eye discomfort  Exercising reg  Three itmes per wk  some caffeine intake, not terribly high    Mother had htn, hx of this    Flu vaccine today.    Review of Systems No headache, no major weight loss or weight gain, no chest pain no back pain abdominal pain no change in bowel habits complete ROS otherwise negative     Objective:   Physical Exam Alert vitals stable, NAD. Blood pressure 140/92 on repeat. HEENT normal. Lungs clear. Heart regular rate and rhythm.        Assessment & Plan:  Impression essential hypertension very long discussion held. Strong family history. Patient getting symptoms with diffuse headache. Plan press on with enalapril 5 daily at bedtime. Follow-up as scheduled diet exercise discussed educational information given 25 minutes spent most in discussion WSL

## 2016-02-27 ENCOUNTER — Ambulatory Visit: Payer: Medicaid Other | Admitting: Family Medicine

## 2016-03-22 ENCOUNTER — Other Ambulatory Visit: Payer: Self-pay | Admitting: Obstetrics and Gynecology

## 2016-03-23 LAB — CYTOLOGY - PAP

## 2016-03-29 ENCOUNTER — Ambulatory Visit: Payer: Medicaid Other | Admitting: Family Medicine

## 2016-04-05 ENCOUNTER — Encounter: Payer: Self-pay | Admitting: Family Medicine

## 2016-04-05 ENCOUNTER — Ambulatory Visit (INDEPENDENT_AMBULATORY_CARE_PROVIDER_SITE_OTHER): Payer: Medicaid Other | Admitting: Family Medicine

## 2016-04-05 DIAGNOSIS — I10 Essential (primary) hypertension: Secondary | ICD-10-CM | POA: Diagnosis not present

## 2016-04-05 MED ORDER — LOSARTAN POTASSIUM 50 MG PO TABS: 50.0000 mg | ORAL_TABLET | Freq: Every day | ORAL | 3 refills | Status: DC

## 2016-04-05 NOTE — Progress Notes (Signed)
   Subjective:    Patient ID: Zoe Evans, female    DOB: 10/30/1985, 30 y.o.   MRN: [id number]  Hypertension  This is a recurrent problem. The current episode started more than 1 month ago.   Patient in today for a 1 month follow up for hypertension.   Still up spome, not  A good enough res[onse  No side effects  eting not the bvest  Trying to wagch salt intke  States no other concerns this visit.   Mainly at nigh   Patient patient feels her cough definitely is coming from the enalapril. Tickle-like cough. Has been going on for several weeks now.  Not exercising due to busy schedule   Review of Systems No headache, no major weight loss or weight gain, no chest pain no back pain abdominal pain no change in bowel habits complete ROS otherwise negative     Objective:   Physical Exam Blood pressure 130/90 on repeat Alert vitals stable, NAD. Blood pressure not ideal good on repeat. HEENT normal. Lungs clear. Heart regular rate and rhythm.        Assessment & Plan:  Impression 1 hypertension improved control but still suboptimal discussed #2 ACE induced cough plan switch to losartan. Exercise diet discussed. Recheck in several months WSL

## 2016-04-13 ENCOUNTER — Ambulatory Visit (INDEPENDENT_AMBULATORY_CARE_PROVIDER_SITE_OTHER): Payer: Medicaid Other | Admitting: Family Medicine

## 2016-04-13 ENCOUNTER — Encounter: Payer: Self-pay | Admitting: Family Medicine

## 2016-04-13 VITALS — BP 128/82 | Ht 63.0 in | Wt 186.0 lb

## 2016-04-13 DIAGNOSIS — I10 Essential (primary) hypertension: Secondary | ICD-10-CM | POA: Diagnosis not present

## 2016-04-13 NOTE — Progress Notes (Signed)
   Subjective:    Patient ID: Zoe Evans, female    DOB: 10/07/1985, 30 y.o.   MRN: [id number]  HPI Patient arrives with elevate blood pressure this am. Patient states her blood pressure med was just changed a few weeks ago. Patient also reports her face was swollen and both sides of her jaw were sore and her chest and back are very sore to the touch.  Patient also having fatigue symptoms.  140 110   achiness and pain the jaws and chest and back and muscles and headache  Review of Systems No headache, no major weight loss or weight gain, no chest pain no back pain abdominal pain no change in bowel habits complete ROS otherwise negative     Objective:   Physical Exam Alert vitals stable, NAD. Blood pressure good on repeat. HEENT normal. Lungs clear. Heart regular rate and rhythm.       Assessment & Plan:  Impression hypertension recent change in medication. #P decent #2 acute achiness may well be viral syndrome plan maintain same medication. Symptom care discussed warning signs discussed WSL

## 2016-04-24 ENCOUNTER — Encounter: Payer: Self-pay | Admitting: Family Medicine

## 2016-08-03 ENCOUNTER — Other Ambulatory Visit: Payer: Self-pay | Admitting: Family Medicine

## 2016-08-04 ENCOUNTER — Ambulatory Visit (INDEPENDENT_AMBULATORY_CARE_PROVIDER_SITE_OTHER): Payer: Medicaid Other | Admitting: Nurse Practitioner

## 2016-08-04 VITALS — BP 126/90 | Temp 98.5°F | Ht 63.0 in | Wt 190.0 lb

## 2016-08-04 DIAGNOSIS — J0100 Acute maxillary sinusitis, unspecified: Secondary | ICD-10-CM

## 2016-08-04 MED ORDER — METHYLPREDNISOLONE ACETATE 40 MG/ML IJ SUSP
40.0000 mg | Freq: Once | INTRAMUSCULAR | Status: AC
  Administered 2016-08-04: 40 mg via INTRAMUSCULAR

## 2016-08-04 MED ORDER — AMOXICILLIN-POT CLAVULANATE 875-125 MG PO TABS: 1.0000 | ORAL_TABLET | Freq: Two times a day (BID) | ORAL | 0 refills | Status: DC

## 2016-08-06 ENCOUNTER — Encounter: Payer: Self-pay | Admitting: Nurse Practitioner

## 2016-08-06 ENCOUNTER — Telehealth: Payer: Self-pay | Admitting: Nurse Practitioner

## 2016-08-06 ENCOUNTER — Other Ambulatory Visit: Payer: Self-pay | Admitting: Nurse Practitioner

## 2016-08-06 MED ORDER — PREDNISONE 20 MG PO TABS: ORAL_TABLET | ORAL | 0 refills | Status: DC

## 2016-08-06 NOTE — Telephone Encounter (Signed)
Sent in Prednisone taper. Call back if no better.

## 2016-08-06 NOTE — Telephone Encounter (Signed)
Saw Zoe Evans and told to call back if not getting better. Got a shot.  She says she still has headache and sinus pain. Didn't get much better.  Please call [phone]   thanks

## 2016-08-06 NOTE — Progress Notes (Signed)
Subjective:  Presents for c/o sinus symptoms for about a week. No fever. PND. Sore throat has improved. Maxillary area headache. Runny nose. Cough worse at night. Ear pain. Pain radiating into the teeth. No wheezing.    Objective:   BP 126/90   Temp 98.5 F (36.9 C) (Oral)   Ht 5\' 3"  (1.6 m)   Wt 190 lb (86.2 kg)   BMI 33.66 kg/m  NAD. Alert, oriented. TMs retracted, no erythema. Pharynx injected with PND noted. Neck supple with mild anterior adenopathy. Lungs clear. Heart RRR.   Assessment:  Acute non-recurrent maxillary sinusitis - Plan: methylPREDNISolone acetate (DEPO-MEDROL) injection 40 mg    Plan:   Meds ordered this encounter  Medications  . amoxicillin-clavulanate (AUGMENTIN) 875-125 MG tablet    Sig: Take 1 tablet by mouth 2 (two) times daily.    Dispense:  20 tablet    Refill:  0    Order Specific Question:   Supervising Provider    Answer:   Merlyn AlbertLUKING, WILLIAM S [[id number]]  . methylPREDNISolone acetate (DEPO-MEDROL) injection 40 mg   Call back in 48 hours if no improvement, plan oral steroids at that time. Recheck if worsens or persists. Warning signs reviewed.

## 2016-08-06 NOTE — Telephone Encounter (Signed)
Spoke with patient and informed her per Nathaneil Canaryarolyn Hoskins,NP- Sent in prednisone taper. Call back if no better. Patient verbalized understanding.

## 2016-08-12 ENCOUNTER — Telehealth: Payer: Self-pay | Admitting: Family Medicine

## 2016-08-12 ENCOUNTER — Other Ambulatory Visit: Payer: Self-pay | Admitting: Nurse Practitioner

## 2016-08-12 MED ORDER — CEFDINIR 300 MG PO CAPS: 300.0000 mg | ORAL_CAPSULE | Freq: Two times a day (BID) | ORAL | 0 refills | Status: DC

## 2016-08-12 NOTE — Telephone Encounter (Signed)
(  Message for Zoe JonesCarolyn) patient was seen on 3/7 and not feeling any better still having sinus problems,headache every day,pressure under eyes and teeth hurting [facility] [place]

## 2016-08-12 NOTE — Telephone Encounter (Signed)
Patient called to check on message.  She is hoping to speak to someone before the end of th day.

## 2016-08-12 NOTE — Telephone Encounter (Signed)
Spoke with patient and informed her per Nathaneil Canaryarolyn Hoskins,NP- Could not use Levaquin with Celexa due to potential adverse reaction. Sent in another antibiotic. Let us  know if symptoms persist. Make sure you are  using nasal spray and antihistamine daily. Patient verbalized understanding and stated that prednisone is not helping.

## 2016-08-12 NOTE — Telephone Encounter (Signed)
Could not use Levaquin with Celexa due to potential adverse reaction. Sent in another antibiotic. Let me know if symptoms persist. Make sure she is using nasal spray and antihistamine daily. Did prednisone help at all?

## 2016-08-20 ENCOUNTER — Other Ambulatory Visit: Payer: Self-pay | Admitting: Family Medicine

## 2016-08-20 MED ORDER — FLUCONAZOLE 150 MG PO TABS: ORAL_TABLET | ORAL | 0 refills | Status: DC

## 2016-08-20 NOTE — Telephone Encounter (Signed)
Diflucan 150 numb two one p o three d apart 

## 2016-08-20 NOTE — Telephone Encounter (Signed)
Pt is needing something called in for a yeast inf. Pt was recently on antibiotics.  [facility] [place]

## 2016-08-20 NOTE — Telephone Encounter (Signed)
Patient is currently on Celexa and system flags Diflucan due to possible reaction of QT prolongation. May we prescribe Diflucan for yeast infection while patient is on Celexa. Please advise?

## 2016-08-25 ENCOUNTER — Other Ambulatory Visit: Payer: Self-pay | Admitting: *Deleted

## 2016-08-25 ENCOUNTER — Telehealth: Payer: Self-pay | Admitting: Family Medicine

## 2016-08-25 MED ORDER — FLUCONAZOLE 150 MG PO TABS: ORAL_TABLET | ORAL | 0 refills | Status: DC

## 2016-08-25 NOTE — Telephone Encounter (Signed)
Patient had diflucan called in on 08/20/16 for a yeast infection due to antibiotic use.  She said it has not cleared up all the way yet and wants to know if we can send in another round.   [facility] [place]

## 2016-08-25 NOTE — Telephone Encounter (Signed)
Vaginal itching and white discharge. Better than it was but feels like she needs another round of [facility]lucan. [facility] 

## 2016-08-25 NOTE — Telephone Encounter (Signed)
Med sent to pharm. Pt notified on voicemail.  

## 2016-08-25 NOTE — Telephone Encounter (Signed)
Diflucan 150 one p o three d apart 

## 2016-08-25 NOTE — Telephone Encounter (Signed)
Left message to return call to get symptoms °

## 2016-08-26 ENCOUNTER — Other Ambulatory Visit: Payer: Self-pay | Admitting: Nurse Practitioner

## 2016-10-29 ENCOUNTER — Ambulatory Visit (INDEPENDENT_AMBULATORY_CARE_PROVIDER_SITE_OTHER): Payer: Medicaid Other | Admitting: Nurse Practitioner

## 2016-10-29 ENCOUNTER — Telehealth: Payer: Self-pay | Admitting: Nurse Practitioner

## 2016-10-29 ENCOUNTER — Other Ambulatory Visit: Payer: Self-pay | Admitting: Nurse Practitioner

## 2016-10-29 ENCOUNTER — Encounter: Payer: Self-pay | Admitting: Nurse Practitioner

## 2016-10-29 VITALS — BP 120/86 | Temp 98.6°F | Ht 63.0 in | Wt 196.0 lb

## 2016-10-29 DIAGNOSIS — B009 Herpesviral infection, unspecified: Secondary | ICD-10-CM

## 2016-10-29 DIAGNOSIS — J0100 Acute maxillary sinusitis, unspecified: Secondary | ICD-10-CM

## 2016-10-29 MED ORDER — VALACYCLOVIR HCL 1 G PO TABS: 1000.0000 mg | ORAL_TABLET | Freq: Every day | ORAL | 5 refills | Status: DC

## 2016-10-29 MED ORDER — FLUCONAZOLE 150 MG PO TABS: ORAL_TABLET | ORAL | 0 refills | Status: DC

## 2016-10-29 MED ORDER — AMOXICILLIN-POT CLAVULANATE 875-125 MG PO TABS
1.0000 | ORAL_TABLET | Freq: Two times a day (BID) | ORAL | 0 refills | Status: DC
Start: 2016-10-29 — End: 2016-11-17

## 2016-10-29 NOTE — Progress Notes (Signed)
Subjective:  Presents for complaints of a painful rash on the mid right buttock for the past week. Had a similar rash in the exact same area a couple of months ago. Same sexual partner long-term. No other rash. No fever. Also maxillary area sinus pressure radiating into the teeth. Sore throat. Runny nose. Cough worse at night. Postnasal drainage. Ear pain. No wheezing. No vaginal discharge or pelvic pain. Mild tenderness in the right inguinal/pubic area. Significant swelling and tenderness for a few days which is much improved. This started after her rash.  Objective:   BP 120/86   Temp 98.6 F (37 C) (Oral)   Ht 5\' 3"  (1.6 m)   Wt 196 lb (88.9 kg)   BMI 34.72 kg/m  NAD. Alert, oriented. TMs significant clear effusion, no erythema. Pharynx mildly erythematous with PND noted. Neck supple with mild soft anterior adenopathy. Lungs clear. Heart regular rhythm. Localized superficial eroded area slightly pink in color noted in the right mid buttock tender to palpation. No other rash is noted. Mild area of edema on the right mons pubis/inguinal area. No erythema or warmth. Mildly tender.  Assessment:  Herpetic lesions  Acute non-recurrent maxillary sinusitis    Plan:   Meds ordered this encounter  Medications  . amoxicillin-clavulanate (AUGMENTIN) 875-125 MG tablet    Sig: Take 1 tablet by mouth 2 (two) times daily.    Dispense:  20 tablet    Refill:  0    Order Specific Question:   Supervising Provider    Answer:   Merlyn AlbertLUKING, WILLIAM S [[id number]]  . valACYclovir (VALTREX) 1000 MG tablet    Sig: Take 1 tablet (1,000 mg total) by mouth daily.    Dispense:  30 tablet    Refill:  5    Order Specific Question:   Supervising Provider    Answer:   Merlyn AlbertLUKING, WILLIAM S [[id number]]   Discussed options at length. Start daily valacyclovir to attempt to suppress further episodes. Patient understands it will probably have minimal impact on current rash due to length of time she has had it. Call back if rash persists  or recurs. OTC meds as directed for congestion and cough. Callback of sinus symptoms persist.

## 2016-10-29 NOTE — Telephone Encounter (Signed)
Patient is requesting Rx for yeast infection.  She was prescribed an antibiotic today by Eber Jonesarolyn and she said she always ends up with a yeast infection.   [facility] [place]

## 2016-10-29 NOTE — Telephone Encounter (Signed)
Patient has taken without difficulty in the past; done

## 2016-11-10 ENCOUNTER — Telehealth: Payer: Self-pay | Admitting: Family Medicine

## 2016-11-10 NOTE — Telephone Encounter (Signed)
Spoke with patient and patient has c/o tooth pain, sinus headache, congestion, and cough. Denies fever or SOB. Please advise?

## 2016-11-10 NOTE — Telephone Encounter (Signed)
Left message return call 11/10/16 

## 2016-11-10 NOTE — Telephone Encounter (Signed)
Pt called stating that she has finished the antibiotic and that her teeth still hurt. Please advise.    [facility]

## 2016-11-17 ENCOUNTER — Encounter: Payer: Self-pay | Admitting: Nurse Practitioner

## 2016-11-17 ENCOUNTER — Other Ambulatory Visit: Payer: Self-pay | Admitting: Nurse Practitioner

## 2016-11-17 MED ORDER — CEFDINIR 300 MG PO CAPS: 300.0000 mg | ORAL_CAPSULE | Freq: Two times a day (BID) | ORAL | 0 refills | Status: DC

## 2016-11-17 NOTE — Telephone Encounter (Signed)
[facility] to [facility]-see my chart message.

## 2016-11-17 NOTE — Telephone Encounter (Signed)
Please have patient communicate through my chart if she can. Just sent message.

## 2016-11-17 NOTE — Telephone Encounter (Signed)
There was a delay last week in patient answering our message. Did she get treated?

## 2016-11-17 NOTE — Telephone Encounter (Signed)
Pt never got treatment. I called her and she is still having symptoms. Has been taking otc meds.  Teeth are still hurting, slight cough and sinus drainage. No fever, no shortness of breath.

## 2016-11-29 ENCOUNTER — Ambulatory Visit (INDEPENDENT_AMBULATORY_CARE_PROVIDER_SITE_OTHER): Payer: Medicaid Other | Admitting: Family Medicine

## 2016-11-29 ENCOUNTER — Encounter: Payer: Self-pay | Admitting: Family Medicine

## 2016-11-29 ENCOUNTER — Other Ambulatory Visit: Payer: Self-pay | Admitting: Nurse Practitioner

## 2016-11-29 VITALS — BP 138/88 | Ht 63.0 in | Wt 202.2 lb

## 2016-11-29 DIAGNOSIS — F418 Other specified anxiety disorders: Secondary | ICD-10-CM

## 2016-11-29 DIAGNOSIS — I10 Essential (primary) hypertension: Secondary | ICD-10-CM

## 2016-11-29 DIAGNOSIS — M25512 Pain in left shoulder: Secondary | ICD-10-CM

## 2016-11-29 DIAGNOSIS — IMO0001 Reserved for inherently not codable concepts without codable children: Secondary | ICD-10-CM

## 2016-11-29 DIAGNOSIS — Z6835 Body mass index (BMI) 35.0-35.9, adult: Secondary | ICD-10-CM

## 2016-11-29 DIAGNOSIS — E669 Obesity, unspecified: Secondary | ICD-10-CM

## 2016-11-29 MED ORDER — CITALOPRAM HYDROBROMIDE 20 MG PO TABS: ORAL_TABLET | ORAL | 2 refills | Status: DC

## 2016-11-29 MED ORDER — CHLORZOXAZONE 500 MG PO TABS: 500.0000 mg | ORAL_TABLET | Freq: Four times a day (QID) | ORAL | 0 refills | Status: DC | PRN

## 2016-11-29 MED ORDER — PREDNISONE 20 MG PO TABS: ORAL_TABLET | ORAL | 0 refills | Status: DC

## 2016-11-29 MED ORDER — LOSARTAN POTASSIUM 50 MG PO TABS: 50.0000 mg | ORAL_TABLET | Freq: Every day | ORAL | 3 refills | Status: DC

## 2016-11-29 MED ORDER — HYDROCODONE-ACETAMINOPHEN 5-325 MG PO TABS: 1.0000 | ORAL_TABLET | Freq: Four times a day (QID) | ORAL | 0 refills | Status: DC | PRN

## 2016-11-29 MED ORDER — NAPROXEN 500 MG PO TABS: 500.0000 mg | ORAL_TABLET | Freq: Two times a day (BID) | ORAL | 2 refills | Status: DC

## 2016-11-29 NOTE — Progress Notes (Signed)
   Subjective:    Patient ID: Zoe Evans, female    DOB: 02/11/1986, 31 y.o.   MRN: [id number]  Arm Pain   The incident occurred more than 1 week ago. Injury mechanism: lifting  The pain is present in the left shoulder and upper left arm. She has tried acetaminophen and NSAIDs for the symptoms.  Denies any injury. Does a lot of lifting at work. Works in [employer]. Describes pain in the left side of her neck upper trapezius some in the shoulder some in the arms some tingling in the hands denies any other particular troubles.  Does need refills on blood pressure medicine as well as antidepressant  Patient denies any trouble with her blood pressure denies any trouble with her moods states overall she is doing well with the medication Patient states no other concerns this visit.    Review of Systems Please see above denies chest pain shortness breath nausea vomiting diarrhea fever chills sweats    Objective:   Physical Exam Lungs clear heart regular blood pressure good upper trapezius mild tenderness slight decreased range of motion in shoulder strength is good in both arms. Reflexes good. Extremities no edema       Assessment & Plan:  HTN refill medications given follow-up within 6 months diet closely  Mild obesity watch diet stay physically active try to lose weight  Depression/stress-doing well once to continue medication follow-up in 6 months  Left shoulder/neck pain-could be inflammation in the trapezius anti-inflammatory range of motion exercises muscle relaxers in the evening time caution drowsiness and pain medicine for only evening use if any problems or progressive troubles follow-up if not doing better within the next week to follow-up, let us know next week if not doing better may need referral or possibly x-rays

## 2016-11-29 NOTE — Patient Instructions (Signed)

## 2017-02-01 ENCOUNTER — Telehealth: Payer: Self-pay | Admitting: Family Medicine

## 2017-02-01 DIAGNOSIS — Z30432 Encounter for removal of intrauterine contraceptive device: Secondary | ICD-10-CM

## 2017-02-01 NOTE — Telephone Encounter (Signed)
Referral put in.

## 2017-02-01 NOTE — Telephone Encounter (Signed)
Pt needs referral to her OB/GYN to have her IUD removed (referral needed due to Medicaid coverage)  [facility]  Please initiate referral in system so that I may process

## 2017-02-04 ENCOUNTER — Ambulatory Visit (INDEPENDENT_AMBULATORY_CARE_PROVIDER_SITE_OTHER): Payer: Medicaid Other | Admitting: Nurse Practitioner

## 2017-02-04 ENCOUNTER — Encounter: Payer: Self-pay | Admitting: Nurse Practitioner

## 2017-02-04 VITALS — BP 130/88 | Ht 63.0 in | Wt 204.0 lb

## 2017-02-04 DIAGNOSIS — L811 Chloasma: Secondary | ICD-10-CM

## 2017-02-04 DIAGNOSIS — S30861A Insect bite (nonvenomous) of abdominal wall, initial encounter: Secondary | ICD-10-CM

## 2017-02-04 DIAGNOSIS — M2550 Pain in unspecified joint: Secondary | ICD-10-CM | POA: Diagnosis not present

## 2017-02-04 DIAGNOSIS — R5383 Other fatigue: Secondary | ICD-10-CM

## 2017-02-04 DIAGNOSIS — W57XXXA Bitten or stung by nonvenomous insect and other nonvenomous arthropods, initial encounter: Secondary | ICD-10-CM

## 2017-02-04 NOTE — Patient Instructions (Signed)
Melasma Melasma is a skin condition that causes areas of darker coloring. It usually appears on your cheeks, forehead, upper lip, and neck. It does not spread from person to person (non-contagious). These patches can look like a mask. The discolored areas do not itch and are not red or swollen. What are the causes? The cause of melasma is not known. However, it can be started by certain triggers, such as:  Being out in the sun.  Allergies to medicines or cosmetics.  Variations in your hormones, such as: ? Taking birth control. ? Taking hormone replacement therapy. ? Being pregnant.  What increases the risk? Risk factors for melasma include:  Being a woman. It is less common in men.  Having a family history of melasma.  Having darker skin.  Living in a tropical climate.  What are the signs or symptoms? There are no symptoms except for the discolored skin with dark or tan blotches. How is this diagnosed? Melasma is diagnosed based on its physical appearance. Your health care provider may use an ultraviolet light called Wood's lamp to look more closely at your skin. Your health care provider may also take a small sample of your skin (biopsy). This is done to make sure your melasma is not caused by another skin condition. How is this treated? There is no cure for melasma. However, some treatments are available that may lighten the color of the darker patches. Treatment may include:  Medicines, such as bleaching or steroid creams.  Facial or chemical peels.  Laser treatment.  Dermabrasion or microdermabrasion.  Your melasma may also go away on its own over time. Follow these instructions at home:  Avoid overexposure to the sun, especially in tropical areas.  Wear sun screen of SPF 30 or higher every day.  Applymedicines only as directed by your health care provider.  Wear a hat that protects your face from the sun.  Keep all follow-up visits as directed by your health  care provider. This is important. How is this prevented?  Wear sunscreen of SPF 30 or higher every day.  Wear a hat that protects your face from the sun.  Do not use wax to remove excess hair in areas where you have or have had melasma.  Avoid overexposure to the sun, especially in tropical areas.  Use gentle cosmetics that are meant for sensitive skin. Contact a health care provider if:  You have new symptoms.  Your symptoms get worse.  Your affected skin areas are: ? Bleeding. ? Irritated. This information is not intended to replace advice given to you by your health care provider. Make sure you discuss any questions you have with your health care provider. Document Released: 08/07/2002 Document Revised: 10/23/2015 Document Reviewed: 12/18/2013 Elsevier Interactive Patient Education  2018 Elsevier Inc.  

## 2017-02-05 ENCOUNTER — Encounter: Payer: Self-pay | Admitting: Nurse Practitioner

## 2017-02-05 NOTE — Progress Notes (Signed)
Subjective:  Presents for complaints of brown spots on her face that began 2-3 months ago. Nonpruritic. Has been wearing sunscreen but has been in the sun a lot this summer. Plans to have her IUD removed within the next few months. Is engaged and plans to conceive again. Over the past 6 months has noticed increased fatigue and sweats and joint pain in multiple joints including knees ankles and hands. Significant swelling in the hands and fingers to the point that she has had to stop wearing some of her rings. No erythema of the joints. Has a history of tick bite in the mid abdominal area within the past few months. No other rash. No documented fever. Feels flushed with sweats especially at nighttime. Has a red spot on her calf area that is been there for while with only slight change.  Objective:   BP 130/88   Ht  (1.6 m)   Wt 204 lb (92.5 kg)   BMI 36.14 kg/m  NAD. Alert, oriented. Lungs clear. Heart regular rate rhythm. Nonraised mostly discrete brown macules noted on the facial area. Significant tanning noted. One small pink slightly raised papule on the calf area. Mild edema of the fingers of both hands, no erythema or warmth noted. A pink papule is noted to the right of the umbilical area at the site of her previous tick bite.  Assessment:  Multiple joint pain - Plan: Rheumatoid factor  Fatigue, unspecified type - Plan: CBC with Differential/Platelet  Melasma - Plan: CBC with Differential/Platelet  Tick bite of abdomen, initial encounter - Plan: Antinuclear Antib (ANA), Rheumatoid factor, B. burgdorfi antibodies, Rocky mtn spotted fvr abs pnl(IgG+IgM)    Plan: Labs pending. Given information on measures to help with melasma which is most likely due to sun exposure. Continue to monitor lesion on her leg, reviewed warning signs. Contact office if any changes. Further follow-up based on test results.

## 2017-02-09 LAB — CBC WITH DIFFERENTIAL/PLATELET
Basophils Absolute: 0.1 10*3/uL (ref 0.0–0.2)
Basos: 1 %
EOS (ABSOLUTE): 0.1 10*3/uL (ref 0.0–0.4)
EOS: 2 %
HEMATOCRIT: 39.3 % (ref 34.0–46.6)
Hemoglobin: 13.2 g/dL (ref 11.1–15.9)
IMMATURE GRANS (ABS): 0 10*3/uL (ref 0.0–0.1)
IMMATURE GRANULOCYTES: 0 %
LYMPHS: 29 %
Lymphocytes Absolute: 1.9 10*3/uL (ref 0.7–3.1)
MCH: 29.5 pg (ref 26.6–33.0)
MCHC: 33.6 g/dL (ref 31.5–35.7)
MCV: 88 fL (ref 79–97)
MONOCYTES: 8 %
MONOS ABS: 0.5 10*3/uL (ref 0.1–0.9)
NEUTROS PCT: 60 %
Neutrophils Absolute: 3.9 10*3/uL (ref 1.4–7.0)
Platelets: 343 10*3/uL (ref 150–379)
RBC: 4.48 x10E6/uL (ref 3.77–5.28)
RDW: 13.4 % (ref 12.3–15.4)
WBC: 6.5 10*3/uL (ref 3.4–10.8)

## 2017-02-09 LAB — ROCKY MTN SPOTTED FVR ABS PNL(IGG+IGM)
RMSF IGG: NEGATIVE
RMSF IgM: 0.28 index (ref 0.00–0.89)

## 2017-02-09 LAB — B. BURGDORFI ANTIBODIES

## 2017-02-09 LAB — ANA: Anti Nuclear Antibody(ANA): NEGATIVE

## 2017-02-09 LAB — RHEUMATOID FACTOR

## 2017-04-21 ENCOUNTER — Encounter: Payer: Self-pay | Admitting: Nurse Practitioner

## 2017-04-22 ENCOUNTER — Other Ambulatory Visit: Payer: Self-pay | Admitting: Family Medicine

## 2017-05-10 ENCOUNTER — Ambulatory Visit: Payer: Medicaid Other | Admitting: Family Medicine

## 2017-05-10 ENCOUNTER — Encounter: Payer: Self-pay | Admitting: Family Medicine

## 2017-05-10 VITALS — BP 132/90 | Temp 98.6°F | Ht 63.0 in | Wt 210.0 lb

## 2017-05-10 DIAGNOSIS — J329 Chronic sinusitis, unspecified: Secondary | ICD-10-CM | POA: Diagnosis not present

## 2017-05-10 DIAGNOSIS — J40 Bronchitis, not specified as acute or chronic: Secondary | ICD-10-CM

## 2017-05-10 DIAGNOSIS — J310 Chronic rhinitis: Secondary | ICD-10-CM

## 2017-05-10 MED ORDER — ALBUTEROL SULFATE HFA 108 (90 BASE) MCG/ACT IN AERS: INHALATION_SPRAY | RESPIRATORY_TRACT | 0 refills | Status: DC

## 2017-05-10 MED ORDER — HYDROCODONE-HOMATROPINE 5-1.5 MG/5ML PO SYRP: ORAL_SOLUTION | ORAL | 0 refills | Status: DC

## 2017-05-10 MED ORDER — CEFDINIR 300 MG PO CAPS: 300.0000 mg | ORAL_CAPSULE | Freq: Two times a day (BID) | ORAL | 0 refills | Status: DC

## 2017-05-10 NOTE — Progress Notes (Signed)
   Subjective:    Patient ID: Zoe Evans, female    DOB: 08/17/1985, 31 y.o.   MRN: [id number]  Cough  This is a new problem. Episode onset: over one week. Associated symptoms include chest pain and headaches. Associated symptoms comments: Runny nose. Treatments tried: robitussin, cold and flu meds.   Started over a wek ago like a cold, now getting worse and worse and worse   thrat kind of scratchy feeling a bit congested and coughing  Pt does not  Not productive cough   No fever  h a the   Cough makes frontal h a and dim energy      Review of Systems  Respiratory: Positive for cough.   Cardiovascular: Positive for chest pain.  Neurological: Positive for headaches.       Objective:   Physical Exam  Alert, mild malaise. Hydration good Vitals stable. frontal/ maxillary tenderness evident positive nasal congestion. pharynx normal neck supple  lungs clear/no crackles or wheezes. heart regular in rhythm       Assessment & Plan:  Impression rhinosinusitis/bronchitis likely post viral, discussed with patient. plan antibiotics prescribed. Questions answered. Symptomatic care discussed. warning signs discussed. WSL

## 2017-05-16 ENCOUNTER — Encounter: Payer: Self-pay | Admitting: Family Medicine

## 2017-07-06 ENCOUNTER — Telehealth: Payer: Self-pay | Admitting: Family Medicine

## 2017-07-06 MED ORDER — AMLODIPINE BESYLATE 2.5 MG PO TABS: 2.5000 mg | ORAL_TABLET | Freq: Every day | ORAL | 2 refills | Status: DC

## 2017-07-06 NOTE — Telephone Encounter (Signed)
I called and left a message asked that she r/c. I have sent in her new medication Amlodipine to [facility] and have d/c'd the Losartan.

## 2017-07-06 NOTE — Telephone Encounter (Signed)
1.  Stop losartan No. 2 start amlodipine 2.5 mg 1 daily #3 May send in 30 tablets with 2 refills #4 I recommend a follow-up office visit within 5-7 days to check blood pressure then after that OB/GYN will more than likely assume control on this

## 2017-07-06 NOTE — Telephone Encounter (Signed)
Pt states she is on Losartan 50 mg one daily and states she is six weeks pregnant. Says she has heard she should not be on it while pregnant. She states her BP's are good.Please advise.

## 2017-07-06 NOTE — Telephone Encounter (Signed)
Patient called to check on this message.  She would like a call back today.

## 2017-07-06 NOTE — Telephone Encounter (Signed)
I called and left a message to r/c. 

## 2017-07-06 NOTE — Telephone Encounter (Signed)
Patient wanting to discuss her blood pressure medication with being pregnant will she need to adjust it. She doesn't have appointment with OB doctor until 2/27 and your first available was 2/11 at 8 am. Do I need to bring in earlier?

## 2017-07-07 NOTE — Telephone Encounter (Signed)
Patient is aware 

## 2017-07-11 ENCOUNTER — Ambulatory Visit: Payer: Medicaid Other | Admitting: Family Medicine

## 2017-07-15 ENCOUNTER — Ambulatory Visit (INDEPENDENT_AMBULATORY_CARE_PROVIDER_SITE_OTHER): Payer: BLUE CROSS/BLUE SHIELD | Admitting: Nurse Practitioner

## 2017-07-15 ENCOUNTER — Encounter: Payer: Self-pay | Admitting: Nurse Practitioner

## 2017-07-15 VITALS — BP 122/78 | Ht 63.0 in | Wt 203.6 lb

## 2017-07-15 DIAGNOSIS — F418 Other specified anxiety disorders: Secondary | ICD-10-CM

## 2017-07-15 DIAGNOSIS — I10 Essential (primary) hypertension: Secondary | ICD-10-CM | POA: Diagnosis not present

## 2017-07-16 ENCOUNTER — Encounter: Payer: Self-pay | Admitting: Nurse Practitioner

## 2017-07-16 NOTE — Progress Notes (Signed)
Subjective: Presents with her fianc to discuss her medications.  States she is approximately [redacted] weeks pregnant.  Has prenatal care schedule.  Has been weaning off her Celexa, down to 5 mg/day.  Her blood pressure pill was recently switched to amlodipine by Dr. Brett CanalesSteve.  Patient very upset because her gynecology office contact her this week regarding an abnormal Pap smear and wants to schedule colposcopy with possible biopsy next week.  Information regarding Pap smear is not available during office visit.  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   Objective:   BP 122/78   Ht 5\' 3"  (1.6 m)   Wt 203 lb 9.6 oz (92.4 kg)   BMI 36.07 kg/m  NAD.  Alert, oriented.  Lungs clear.  Heart regular rate and rhythm.  Crying frequently during office visit especially when talking about upcoming OB/GYN visit.  Thoughts logical coherent and relevant.  Dressed appropriately.  Making good eye contact.  Assessment:  Problem List Items Addressed This Visit      Cardiovascular and Mediastinum   Essential hypertension - Primary     Other   Depression with anxiety       Plan: Patient is very emotional most likely exacerbated by weaning off her Celexa in pregnancy.  Reviewed recent information on colposcopy and biopsy during pregnancy.  Encourage patient to ask questions and research information for herself.  Reminded her that she will make the ultimate decision about anything to be done during pregnancy. 25 minutes was spent with the patient.  This statement verifies that 25 minutes was indeed spent with the patient. Greater than half the time was spent in discussion, counseling and answering questions  regarding the issues that the patient came in for today as reflected in the diagnosis (s) please refer to documentation for further details.

## 2017-07-19 ENCOUNTER — Ambulatory Visit: Payer: BLUE CROSS/BLUE SHIELD | Admitting: Family Medicine

## 2017-07-20 ENCOUNTER — Other Ambulatory Visit: Payer: Self-pay

## 2017-07-20 ENCOUNTER — Encounter (HOSPITAL_COMMUNITY): Payer: Self-pay | Admitting: *Deleted

## 2017-07-20 ENCOUNTER — Inpatient Hospital Stay (HOSPITAL_COMMUNITY): Payer: BLUE CROSS/BLUE SHIELD

## 2017-07-20 ENCOUNTER — Inpatient Hospital Stay (HOSPITAL_COMMUNITY)
Admission: AD | Admit: 2017-07-20 | Discharge: 2017-07-20 | Disposition: A | Discharge status: Home / Self Care | Payer: BLUE CROSS/BLUE SHIELD | Source: Ambulatory Visit | Attending: Obstetrics | Admitting: Obstetrics

## 2017-07-20 DIAGNOSIS — F419 Anxiety disorder, unspecified: Secondary | ICD-10-CM | POA: Diagnosis not present

## 2017-07-20 DIAGNOSIS — Z87891 Personal history of nicotine dependence: Secondary | ICD-10-CM | POA: Insufficient documentation

## 2017-07-20 DIAGNOSIS — Z3A01 Less than 8 weeks gestation of pregnancy: Secondary | ICD-10-CM

## 2017-07-20 DIAGNOSIS — O161 Unspecified maternal hypertension, first trimester: Secondary | ICD-10-CM | POA: Insufficient documentation

## 2017-07-20 DIAGNOSIS — O208 Other hemorrhage in early pregnancy: Secondary | ICD-10-CM | POA: Insufficient documentation

## 2017-07-20 DIAGNOSIS — Z79899 Other long term (current) drug therapy: Secondary | ICD-10-CM | POA: Insufficient documentation

## 2017-07-20 DIAGNOSIS — O418X10 Other specified disorders of amniotic fluid and membranes, first trimester, not applicable or unspecified: Secondary | ICD-10-CM

## 2017-07-20 DIAGNOSIS — N898 Other specified noninflammatory disorders of vagina: Secondary | ICD-10-CM | POA: Diagnosis present

## 2017-07-20 DIAGNOSIS — F329 Major depressive disorder, single episode, unspecified: Secondary | ICD-10-CM | POA: Diagnosis not present

## 2017-07-20 DIAGNOSIS — O34219 Maternal care for unspecified type scar from previous cesarean delivery: Secondary | ICD-10-CM | POA: Insufficient documentation

## 2017-07-20 DIAGNOSIS — Z888 Allergy status to other drugs, medicaments and biological substances status: Secondary | ICD-10-CM | POA: Insufficient documentation

## 2017-07-20 DIAGNOSIS — IMO0001 Reserved for inherently not codable concepts without codable children: Secondary | ICD-10-CM

## 2017-07-20 DIAGNOSIS — Z885 Allergy status to narcotic agent status: Secondary | ICD-10-CM | POA: Insufficient documentation

## 2017-07-20 DIAGNOSIS — O468X1 Other antepartum hemorrhage, first trimester: Secondary | ICD-10-CM

## 2017-07-20 DIAGNOSIS — O209 Hemorrhage in early pregnancy, unspecified: Secondary | ICD-10-CM

## 2017-07-20 DIAGNOSIS — Z8249 Family history of ischemic heart disease and other diseases of the circulatory system: Secondary | ICD-10-CM | POA: Diagnosis not present

## 2017-07-20 DIAGNOSIS — O99341 Other mental disorders complicating pregnancy, first trimester: Secondary | ICD-10-CM | POA: Diagnosis not present

## 2017-07-20 HISTORY — DX: Essential (primary) hypertension: I10

## 2017-07-20 HISTORY — DX: Major depressive disorder, single episode, unspecified: F32.9

## 2017-07-20 HISTORY — DX: Anxiety disorder, unspecified: F41.9

## 2017-07-20 HISTORY — DX: Depression, unspecified: F32.A

## 2017-07-20 LAB — URINALYSIS, ROUTINE W REFLEX MICROSCOPIC
BILIRUBIN URINE: NEGATIVE
GLUCOSE, UA: NEGATIVE mg/dL
KETONES UR: NEGATIVE mg/dL
Nitrite: NEGATIVE
PROTEIN: NEGATIVE mg/dL
Specific Gravity, Urine: 1.021 (ref 1.005–1.030)
pH: 6 (ref 5.0–8.0)

## 2017-07-20 LAB — ABO/RH: ABO/RH(D): B POS

## 2017-07-20 LAB — CBC
HCT: 38.3 % (ref 36.0–46.0)
Hemoglobin: 13 g/dL (ref 12.0–15.0)
MCH: 29.3 pg (ref 26.0–34.0)
MCHC: 33.9 g/dL (ref 30.0–36.0)
MCV: 86.3 fL (ref 78.0–100.0)
PLATELETS: 298 10*3/uL (ref 150–400)
RBC: 4.44 MIL/uL (ref 3.87–5.11)
RDW: 12.6 % (ref 11.5–15.5)
WBC: 12.4 10*3/uL — ABNORMAL HIGH (ref 4.0–10.5)

## 2017-07-20 LAB — HCG, QUANTITATIVE, PREGNANCY: hCG, Beta Chain, Quant, S: 163187 m[IU]/mL — ABNORMAL HIGH (ref <5)

## 2017-07-20 LAB — RAPID URINE DRUG SCREEN, HOSP PERFORMED
Amphetamines: NOT DETECTED
Barbiturates: NOT DETECTED
Benzodiazepines: NOT DETECTED
COCAINE: NOT DETECTED
Opiates: NOT DETECTED
Tetrahydrocannabinol: NOT DETECTED

## 2017-07-20 LAB — POCT PREGNANCY, URINE: Preg Test, Ur: POSITIVE — AB

## 2017-07-20 NOTE — MAU Provider Note (Signed)
History     CSN: [id number]  Arrival date and time: 07/20/17 1544   None     Chief Complaint  Patient presents with  . Vaginal Bleeding  . Abdominal Pain  . Possible Pregnancy   HPI   [name] is a 32 y.o. female G2P1001 @ [redacted]w[redacted]d here in MAU with vaginal bleeding. States she is early pregnant, however is not scheduled to see OB until later this week.  Symptoms started today around 1330. No recent intercourse.  States she noticed the bleeding only when she wipes. Describes it similar to when she starts day 1 of her menstrual cycle. Some mild cramping. Very worried.   OB History    Gravida Para Term Preterm AB Living   2 1 1     1    SAB TAB Ectopic Multiple Live Births           1      Past Medical History:  Diagnosis Date  . Anxiety   . Back pain   . Depression   . Hypertension     Past Surgical History:  Procedure Laterality Date  . CESAREAN SECTION      Family History  Problem Relation Age of Onset  . Breast cancer Maternal Aunt 30       3 maternal cousins as well  . Breast cancer Maternal Grandmother 40  . Breast cancer Cousin   . Breast cancer Cousin   . Breast cancer Cousin   . Hypertension Mother     Social History   Tobacco Use  . Smoking status: Former Smoker    Packs/day: 0.50    Last attempt to quit: 05/31/2014    Years since quitting: 3.1  . Smokeless tobacco: Never Used  Substance Use Topics  . Alcohol use: Yes    Alcohol/week: 0.6 oz    Types: 1 Glasses of wine per week    Comment: occasional  . Drug use: No    Allergies:  Allergies  Allergen Reactions  . Ace Inhibitors Cough  . Codeine Nausea And Vomiting    "liquid codeine"    Medications Prior to Admission  Medication Sig Dispense Refill Last Dose  . amLODipine (NORVASC) 2.5 MG tablet Take 1 tablet (2.5 mg total) by mouth daily. 30 tablet 2 07/20/2017 at Unknown time  . cetirizine (ZYRTEC) 10 MG tablet Take 10 mg by mouth daily.   07/20/2017 at Unknown time  .  citalopram (CELEXA) 20 MG tablet Take 20 mg by mouth daily. Patient takes one half tablet daily   07/19/2017 at Unknown time  . Prenatal Vit-Fe Fumarate-FA (PRENATAL MULTIVITAMIN) TABS tablet Take 1 tablet by mouth daily at 12 noon.   07/19/2017 at Unknown time  . citalopram (CELEXA) 20 MG tablet TAKE ONE & ONE-HALF TABLETS BY MOUTH ONCE DAILY (Patient not taking: Reported on 07/20/2017) 135 tablet 2 Not Taking at Unknown time   Results for orders placed or performed during the hospital encounter of 07/20/17 (from the past 48 hour(s))  Urinalysis, Routine w reflex microscopic     Status: Abnormal   Collection Time: 07/20/17  3:50 PM  Result Value Ref Range   Color, Urine YELLOW YELLOW   APPearance HAZY (A) CLEAR   Specific Gravity, Urine 1.021 1.005 - 1.030   pH 6.0 5.0 - 8.0   Glucose, UA NEGATIVE NEGATIVE mg/dL   Hgb urine dipstick LARGE (A) NEGATIVE   Bilirubin Urine NEGATIVE NEGATIVE   Ketones, ur NEGATIVE NEGATIVE mg/dL   Protein, ur  NEGATIVE NEGATIVE mg/dL   Nitrite NEGATIVE NEGATIVE   Leukocytes, UA SMALL (A) NEGATIVE   RBC / HPF 6-30 0 - 5 RBC/hpf   WBC, UA 6-30 0 - 5 WBC/hpf   Bacteria, UA RARE (A) NONE SEEN   Squamous Epithelial / LPF 0-5 (A) NONE SEEN   Mucus PRESENT     Comment: Performed at [facility], [address] [place], [place] [address]  Urine rapid drug screen (hosp performed)     Status: None   Collection Time: 07/20/17  3:50 PM  Result Value Ref Range   Opiates NONE DETECTED NONE DETECTED   Cocaine NONE DETECTED NONE DETECTED   Benzodiazepines NONE DETECTED NONE DETECTED   Amphetamines NONE DETECTED NONE DETECTED   Tetrahydrocannabinol NONE DETECTED NONE DETECTED   Barbiturates NONE DETECTED NONE DETECTED    Comment: (NOTE) DRUG SCREEN FOR MEDICAL PURPOSES ONLY.  IF CONFIRMATION IS NEEDED FOR ANY PURPOSE, NOTIFY LAB WITHIN 5 DAYS. LOWEST DETECTABLE LIMITS FOR URINE DRUG SCREEN Drug Class                     Cutoff (ng/mL) Amphetamine and  metabolites    1000 Barbiturate and metabolites    200 Benzodiazepine                 200 Tricyclics and metabolites     300 Opiates and metabolites        300 Cocaine and metabolites        300 THC                            50 Performed at [facility], [address] [place], [place] [address]   Pregnancy, urine POC     Status: Abnormal   Collection Time: 07/20/17  4:04 PM  Result Value Ref Range   Preg Test, Ur POSITIVE (A) NEGATIVE    Comment:        THE SENSITIVITY OF THIS METHODOLOGY IS >24 mIU/mL   CBC     Status: Abnormal   Collection Time: 07/20/17  4:16 PM  Result Value Ref Range   WBC 12.4 (H) 4.0 - 10.5 K/uL   RBC 4.44 3.87 - 5.11 MIL/uL   Hemoglobin 13.0 12.0 - 15.0 g/dL   HCT 82.938.3 56.236.0 - 13.046.0 %   MCV 86.3 78.0 - 100.0 fL   MCH 29.3 26.0 - 34.0 pg   MCHC 33.9 30.0 - 36.0 g/dL   RDW 86.512.6 78.411.5 - 69.615.5 %   Platelets 298 150 - 400 K/uL    Comment: Performed at [facility], [address] [place], [place] [address]  ABO/Rh     Status: None (Preliminary result)   Collection Time: 07/20/17  4:16 PM  Result Value Ref Range   ABO/RH(D)      B POS Performed at [facility], [address] [place], [place] [address]   hCG, quantitative, pregnancy     Status: Abnormal   Collection Time: 07/20/17  4:16 PM  Result Value Ref Range   hCG, Beta Chain, Quant, S 163,187 (H) <5 mIU/mL    Comment:          GEST. AGE      CONC.  (mIU/mL)   <=1 WEEK        5 - 50     2 WEEKS       50 - 500     3 WEEKS  100 - 10,000     4 WEEKS     1,000 - 30,000     5 WEEKS     3,500 - 115,000   6-8 WEEKS     12,000 - 270,000    12 WEEKS     15,000 - 220,000        FEMALE AND NON-PREGNANT FEMALE:     LESS THAN 5 mIU/mL Performed at [facility] [address], [place], [place] [address]    US Ob Comp Less 14 Wks  Result Date: 07/20/2017 CLINICAL DATA:  Vaginal bleeding and pelvic cramping. Positive urine pregnancy test. Gestational age by LMP of 7 weeks 2 days.  EXAM: OBSTETRIC <14 WK ULTRASOUND TECHNIQUE: Transabdominal ultrasound was performed for evaluation of the gestation as well as the maternal uterus and adnexal regions. COMPARISON:  None. FINDINGS: Intrauterine gestational sac: Single Yolk sac:  Visualized. Embryo:  Visualized. Cardiac Activity: Visualized. Heart Rate: 165 bpm CRL:  13 mm   7 w   3 d                  [place] EDC: 03/05/2018 Subchorionic hemorrhage:  None visualized. Maternal uterus/adnexae: Both ovaries are normal in appearance. No adnexal mass or abnormal free fluid identified. IMPRESSION: Single living IUP measuring 7 weeks 3 days, with [place] EDC of 03/05/2018. No significant maternal uterine or adnexal abnormality identified. Electronically Signed   By: Myles Rosenthal M.D.   On: 07/20/2017 17:17   Review of Systems  Constitutional: Negative for fever.  Gastrointestinal: Negative for abdominal pain.  Genitourinary: Positive for vaginal bleeding. Negative for dysuria.   Physical Exam   Blood pressure 126/83, pulse 83, temperature 98.5 F (36.9 C), temperature source Oral, resp. rate 18, weight 205 lb 12 oz (93.3 kg), last menstrual period 05/30/2017, SpO2 100 %.  Physical Exam  Constitutional: She is oriented to person, place, and time. She appears well-developed and well-nourished. No distress.  HENT:  Head: Normocephalic.  Neck: Neck supple.  GI: Soft. She exhibits no distension. There is no tenderness. There is no rebound.  Genitourinary:  Genitourinary Comments: Bimanual exam: Cervix closed Uterus non tender, enlarged  Adnexa non tender, no masses bilaterally No blood noted on exam glove.  Chaperone present for exam.   Musculoskeletal: Normal range of motion.  Neurological: She is alert and oriented to person, place, and time.  Skin: Skin is warm. She is not diaphoretic.  Psychiatric: Her behavior is normal.    MAU Course  Procedures  None  MDM  B positive blood type.  HIV, CBC, Hcg, ABO US OB transvaginal   Discussed US findings with Dr. Chestine Spore, ok for DC home.   Assessment and Plan   A:  1. Vaginal bleeding in pregnancy, first trimester   2. Subchorionic hemorrhage of placenta in first trimester, single or unspecified fetus     P:  Discharge home in stable condition First trimester warning signs discussed Pelvic rest Return to MAU If symptoms worsen Bleeding precautions  Jeselle Hiser, Harolyn Rutherford, NP 07/20/2017 7:43 PM

## 2017-07-20 NOTE — MAU Note (Signed)
Having bright red blood, first noted around 1300.first episode of bleeding.  Has not been seen yet.  Has been having cramping, like period was going to start.

## 2017-07-20 NOTE — Discharge Instructions (Signed)
Pelvic Rest °Pelvic rest may be recommended if: °· Your placenta is partially or completely covering the opening of your cervix (placenta previa). °· There is bleeding between the wall of the uterus and the amniotic sac in the first trimester of pregnancy (subchorionic hemorrhage). °· You went into labor too early (preterm labor). ° °Based on your overall health and the health of your baby, your health care provider will decide if pelvic rest is right for you. °How do I rest my pelvis? °For as long as told by your health care provider: °· Do not have sex, sexual stimulation, or an orgasm. °· Do not use tampons. Do not douche. Do not put anything in your vagina. °· Do not lift anything that is heavier than 10 lb (4.5 kg). °· Avoid activities that take a lot of effort (are strenuous). °· Avoid any activity in which your pelvic muscles could become strained. ° °When should I seek medical care? °Seek medical care if you have: °· Cramping pain in your lower abdomen. °· Vaginal discharge. °· A low, dull backache. °· Regular contractions. °· Uterine tightening. ° °When should I seek immediate medical care? °Seek immediate medical care if: °· You have vaginal bleeding and you are pregnant. ° °This information is not intended to replace advice given to you by your health care provider. Make sure you discuss any questions you have with your health care provider. °Document Released: 09/11/2010 Document Revised: 10/23/2015 Document Reviewed: 11/18/2014 °Elsevier Interactive Patient Education © 2018 Elsevier Inc. ° °Subchorionic Hematoma °A subchorionic hematoma is a gathering of blood between the outer wall of the placenta and the inner wall of the womb (uterus). The placenta is the organ that connects the fetus to the wall of the uterus. The placenta performs the feeding, breathing (oxygen to the fetus), and waste removal (excretory work) of the fetus. °Subchorionic hematoma is the most common abnormality found on a result  from ultrasonography done during the first trimester or early second trimester of pregnancy. If there has been little or no vaginal bleeding, early small hematomas usually shrink on their own and do not affect your baby or pregnancy. The blood is gradually absorbed over 1-2 weeks. When bleeding starts later in pregnancy or the hematoma is larger or occurs in an older pregnant woman, the outcome may not be as good. Larger hematomas may get bigger, which increases the chances for miscarriage. Subchorionic hematoma also increases the risk of premature detachment of the placenta from the uterus, preterm (premature) labor, and stillbirth. °Follow these instructions at home: °· Stay on bed rest if your health care provider recommends this. Although bed rest will not prevent more bleeding or prevent a miscarriage, your health care provider may recommend bed rest until you are advised otherwise. °· Avoid heavy lifting (more than 10 lb [4.5 kg]), exercise, sexual intercourse, or douching as directed by your health care provider. °· Keep track of the number of pads you use each day and how soaked (saturated) they are. Write down this information. °· Do not use tampons. °· Keep all follow-up appointments as directed by your health care provider. Your health care provider may ask you to have follow-up blood tests or ultrasound tests or both. °Get help right away if: °· You have severe cramps in your stomach, back, abdomen, or pelvis. °· You have a fever. °· You pass large clots or tissue. Save any tissue for your health care provider to look at. °· Your bleeding increases or you become lightheaded,   feel weak, or have fainting episodes. °This information is not intended to replace advice given to you by your health care provider. Make sure you discuss any questions you have with your health care provider. °Document Released: 09/01/2006 Document Revised: 10/23/2015 Document Reviewed: 12/14/2012 °Elsevier Interactive Patient  Education © 2017 Elsevier Inc. ° °

## 2017-07-20 NOTE — MAU Note (Signed)
Pt presents with c/o lower abdominal menstrual cramping and bright red VB with wiping that began today @ 1300.

## 2017-07-21 LAB — HIV ANTIBODY (ROUTINE TESTING W REFLEX): HIV SCREEN 4TH GENERATION: NONREACTIVE

## 2017-07-27 LAB — OB RESULTS CONSOLE ANTIBODY SCREEN: Antibody Screen: NEGATIVE

## 2017-07-27 LAB — OB RESULTS CONSOLE GC/CHLAMYDIA
CHLAMYDIA, DNA PROBE: NEGATIVE
Gonorrhea: NEGATIVE

## 2017-07-27 LAB — OB RESULTS CONSOLE HEPATITIS B SURFACE ANTIGEN: HEP B S AG: NEGATIVE

## 2017-07-27 LAB — OB RESULTS CONSOLE ABO/RH: RH TYPE: POSITIVE

## 2017-07-27 LAB — OB RESULTS CONSOLE RPR: RPR: NONREACTIVE

## 2017-07-27 LAB — OB RESULTS CONSOLE RUBELLA ANTIBODY, IGM: RUBELLA: IMMUNE

## 2017-07-27 LAB — OB RESULTS CONSOLE HIV ANTIBODY (ROUTINE TESTING): HIV: NONREACTIVE

## 2017-09-19 ENCOUNTER — Telehealth: Payer: Self-pay | Admitting: *Deleted

## 2017-09-19 ENCOUNTER — Other Ambulatory Visit: Payer: Self-pay | Admitting: Family Medicine

## 2017-09-19 ENCOUNTER — Encounter: Payer: Self-pay | Admitting: Family Medicine

## 2017-09-19 NOTE — Telephone Encounter (Signed)
Nurse from [facility] calling to find out why pt was switched from losartan to amlodipine. See telephone note from 07/06/17. Pt called and stated she was [redacted] weeks pregnant and heard it was not safe to take losartan and has not had appt with ob gyn yet. Dr Lorin Picketscott changed to amlodipine. Nurse at [facility] notified of the reason for the change.

## 2017-09-19 NOTE — Telephone Encounter (Signed)
ERROR

## 2017-09-28 ENCOUNTER — Encounter: Payer: Self-pay | Admitting: Nurse Practitioner

## 2017-09-28 ENCOUNTER — Other Ambulatory Visit: Payer: Self-pay | Admitting: Family Medicine

## 2017-09-29 ENCOUNTER — Other Ambulatory Visit: Payer: Self-pay | Admitting: Nurse Practitioner

## 2018-01-31 ENCOUNTER — Encounter: Payer: Self-pay | Admitting: *Deleted

## 2018-01-31 LAB — OB RESULTS CONSOLE GBS: GBS: NEGATIVE

## 2018-02-13 ENCOUNTER — Encounter (HOSPITAL_COMMUNITY): Payer: Self-pay

## 2018-02-13 ENCOUNTER — Other Ambulatory Visit: Payer: Self-pay | Admitting: Obstetrics and Gynecology

## 2018-02-14 ENCOUNTER — Telehealth (HOSPITAL_COMMUNITY): Payer: Self-pay | Admitting: *Deleted

## 2018-02-14 NOTE — Telephone Encounter (Signed)
Preadmission screen  

## 2018-02-15 ENCOUNTER — Encounter (HOSPITAL_COMMUNITY): Payer: Self-pay | Admitting: *Deleted

## 2018-02-15 ENCOUNTER — Encounter (HOSPITAL_COMMUNITY): Payer: Self-pay

## 2018-02-24 NOTE — Patient Instructions (Signed)
Zoe Evans  02/24/2018   Your procedure is scheduled on:  02/28/2018  Enter through the Main Entrance of [facility] at 1015 AM.  Pick up the phone at the desk and dial [phone]  Call this number if you have problems the morning of surgery:[phone]  Remember:   Do not eat food:(After Midnight) Desps de medianoche.  Do not drink clear liquids: (After Midnight) Desps de medianoche.  Take these medicines the morning of surgery with A SIP OF WATER: celexa, labetalol and valtrex   Do not wear jewelry, make-up or nail polish.  Do not wear lotions, powders, or perfumes. Do not wear deodorant.  Do not shave 48 hours prior to surgery.  Do not bring valuables to the hospital.  [facility] is not   responsible for any belongings or valuables brought to the hospital.  Contacts, dentures or bridgework may not be worn into surgery.  Leave suitcase in the car. After surgery it may be brought to your room.  For patients admitted to the hospital, checkout time is 11:00 AM the day of              discharge.    N/A   Please read over the following fact sheets that you were given:   Surgical Site Infection Prevention

## 2018-02-27 ENCOUNTER — Encounter (HOSPITAL_COMMUNITY)
Admission: RE | Admit: 2018-02-27 | Discharge: 2018-02-27 | Disposition: A | Discharge status: Home / Self Care | Payer: BC Managed Care – PPO | Source: Ambulatory Visit | Attending: Obstetrics and Gynecology | Admitting: Obstetrics and Gynecology

## 2018-02-27 HISTORY — DX: Unspecified hearing loss, left ear: H91.92

## 2018-02-27 HISTORY — DX: Unspecified abnormal cytological findings in specimens from vagina: R87.629

## 2018-02-27 HISTORY — DX: Anogenital (venereal) warts: A63.0

## 2018-02-27 LAB — TYPE AND SCREEN
ABO/RH(D): B POS
Antibody Screen: NEGATIVE

## 2018-02-27 LAB — CBC
HEMATOCRIT: 34.2 % — AB (ref 36.0–46.0)
HEMOGLOBIN: 11.5 g/dL — AB (ref 12.0–15.0)
MCH: 28.8 pg (ref 26.0–34.0)
MCHC: 33.6 g/dL (ref 30.0–36.0)
MCV: 85.7 fL (ref 78.0–100.0)
Platelets: 243 10*3/uL (ref 150–400)
RBC: 3.99 MIL/uL (ref 3.87–5.11)
RDW: 12.7 % (ref 11.5–15.5)
WBC: 10.5 10*3/uL (ref 4.0–10.5)

## 2018-02-28 ENCOUNTER — Encounter (HOSPITAL_COMMUNITY): Payer: Self-pay | Admitting: *Deleted

## 2018-02-28 ENCOUNTER — Encounter (HOSPITAL_COMMUNITY)
Admission: AD | Disposition: A | Discharge status: Home / Self Care | Payer: Self-pay | Source: Home / Self Care | Attending: Obstetrics and Gynecology

## 2018-02-28 ENCOUNTER — Inpatient Hospital Stay (HOSPITAL_COMMUNITY): Payer: BC Managed Care – PPO

## 2018-02-28 ENCOUNTER — Inpatient Hospital Stay (HOSPITAL_COMMUNITY)
Admission: AD | Admit: 2018-02-28 | Discharge: 2018-03-02 | DRG: 784 | Disposition: A | Discharge status: Home / Self Care | Payer: BC Managed Care – PPO | Attending: Obstetrics and Gynecology | Admitting: Obstetrics and Gynecology

## 2018-02-28 DIAGNOSIS — Z23 Encounter for immunization: Secondary | ICD-10-CM | POA: Diagnosis not present

## 2018-02-28 DIAGNOSIS — O1002 Pre-existing essential hypertension complicating childbirth: Secondary | ICD-10-CM | POA: Diagnosis present

## 2018-02-28 DIAGNOSIS — Z3A39 39 weeks gestation of pregnancy: Secondary | ICD-10-CM | POA: Diagnosis not present

## 2018-02-28 DIAGNOSIS — F419 Anxiety disorder, unspecified: Secondary | ICD-10-CM | POA: Diagnosis present

## 2018-02-28 DIAGNOSIS — O99344 Other mental disorders complicating childbirth: Secondary | ICD-10-CM | POA: Diagnosis present

## 2018-02-28 DIAGNOSIS — Z302 Encounter for sterilization: Secondary | ICD-10-CM

## 2018-02-28 DIAGNOSIS — F329 Major depressive disorder, single episode, unspecified: Secondary | ICD-10-CM | POA: Diagnosis present

## 2018-02-28 DIAGNOSIS — Z3490 Encounter for supervision of normal pregnancy, unspecified, unspecified trimester: Secondary | ICD-10-CM

## 2018-02-28 DIAGNOSIS — O34211 Maternal care for low transverse scar from previous cesarean delivery: Principal | ICD-10-CM | POA: Diagnosis present

## 2018-02-28 LAB — RPR: RPR Ser Ql: NONREACTIVE

## 2018-02-28 SURGERY — Surgical Case
Anesthesia: Spinal | Site: Abdomen | Wound class: Clean Contaminated

## 2018-02-28 MED ORDER — SIMETHICONE 80 MG PO CHEW
80.0000 mg | CHEWABLE_TABLET | ORAL | Status: DC
  Administered 2018-02-28 – 2018-03-01 (×3): 80 mg via ORAL
  Filled 2018-02-28 (×3): qty 1

## 2018-02-28 MED ORDER — LACTATED RINGERS IV SOLN
INTRAVENOUS | Status: DC
  Administered 2018-02-28 (×3): via INTRAVENOUS

## 2018-02-28 MED ORDER — OXYTOCIN 10 UNIT/ML IJ SOLN
INTRAMUSCULAR | Status: AC
  Filled 2018-02-28: qty 4

## 2018-02-28 MED ORDER — OXYCODONE HCL 5 MG/5ML PO SOLN: 5.0000 mg | Freq: Once | ORAL | Status: DC | PRN

## 2018-02-28 MED ORDER — OXYTOCIN 10 UNIT/ML IJ SOLN
INTRAVENOUS | Status: DC | PRN
  Administered 2018-02-28: 40 [IU] via INTRAVENOUS

## 2018-02-28 MED ORDER — LACTATED RINGERS IV SOLN
INTRAVENOUS | Status: DC | PRN
  Administered 2018-02-28: 12:00:00 via INTRAVENOUS

## 2018-02-28 MED ORDER — DIBUCAINE 1 % RE OINT: 1.0000 "application " | TOPICAL_OINTMENT | RECTAL | Status: DC | PRN

## 2018-02-28 MED ORDER — MEPERIDINE HCL 25 MG/ML IJ SOLN: 6.2500 mg | INTRAMUSCULAR | Status: DC | PRN

## 2018-02-28 MED ORDER — ACETAMINOPHEN 325 MG PO TABS
650.0000 mg | ORAL_TABLET | ORAL | Status: DC | PRN
  Administered 2018-02-28 – 2018-03-01 (×2): 650 mg via ORAL
  Filled 2018-02-28 (×2): qty 2

## 2018-02-28 MED ORDER — DEXAMETHASONE SODIUM PHOSPHATE 4 MG/ML IJ SOLN
INTRAMUSCULAR | Status: DC | PRN
  Administered 2018-02-28: 4 mg via INTRAVENOUS

## 2018-02-28 MED ORDER — PHENYLEPHRINE 40 MCG/ML (10ML) SYRINGE FOR IV PUSH (FOR BLOOD PRESSURE SUPPORT)
PREFILLED_SYRINGE | INTRAVENOUS | Status: AC
  Filled 2018-02-28: qty 10

## 2018-02-28 MED ORDER — FENTANYL CITRATE (PF) 100 MCG/2ML IJ SOLN
INTRAMUSCULAR | Status: DC | PRN
  Administered 2018-02-28: 15 ug via INTRATHECAL

## 2018-02-28 MED ORDER — COCONUT OIL OIL: 1.0000 "application " | TOPICAL_OIL | Status: DC | PRN

## 2018-02-28 MED ORDER — DEXAMETHASONE SODIUM PHOSPHATE 4 MG/ML IJ SOLN
INTRAMUSCULAR | Status: AC
  Filled 2018-02-28: qty 1

## 2018-02-28 MED ORDER — ZOLPIDEM TARTRATE 5 MG PO TABS: 5.0000 mg | ORAL_TABLET | Freq: Every evening | ORAL | Status: DC | PRN

## 2018-02-28 MED ORDER — ACETAMINOPHEN 160 MG/5ML PO SOLN: 325.0000 mg | ORAL | Status: DC | PRN

## 2018-02-28 MED ORDER — IBUPROFEN 600 MG PO TABS
600.0000 mg | ORAL_TABLET | Freq: Four times a day (QID) | ORAL | Status: DC
  Administered 2018-02-28 – 2018-03-02 (×8): 600 mg via ORAL
  Filled 2018-02-28 (×8): qty 1

## 2018-02-28 MED ORDER — BUPIVACAINE IN DEXTROSE 0.75-8.25 % IT SOLN
INTRATHECAL | Status: DC | PRN
  Administered 2018-02-28: 1.6 mL via INTRATHECAL

## 2018-02-28 MED ORDER — CEFAZOLIN SODIUM-DEXTROSE 2-4 GM/100ML-% IV SOLN
2.0000 g | INTRAVENOUS | Status: AC
  Administered 2018-02-28: 2 g via INTRAVENOUS

## 2018-02-28 MED ORDER — INFLUENZA VAC SPLIT QUAD 0.5 ML IM SUSY
0.5000 mL | PREFILLED_SYRINGE | INTRAMUSCULAR | Status: AC
  Administered 2018-03-02: 0.5 mL via INTRAMUSCULAR
  Filled 2018-02-28: qty 0.5

## 2018-02-28 MED ORDER — FENTANYL CITRATE (PF) 100 MCG/2ML IJ SOLN
INTRAMUSCULAR | Status: AC
  Filled 2018-02-28: qty 2

## 2018-02-28 MED ORDER — ONDANSETRON HCL 4 MG/2ML IJ SOLN
INTRAMUSCULAR | Status: AC
  Filled 2018-02-28: qty 2

## 2018-02-28 MED ORDER — OXYCODONE HCL 5 MG PO TABS: 5.0000 mg | ORAL_TABLET | Freq: Once | ORAL | Status: DC | PRN

## 2018-02-28 MED ORDER — ONDANSETRON HCL 4 MG/2ML IJ SOLN: 4.0000 mg | Freq: Once | INTRAMUSCULAR | Status: DC | PRN

## 2018-02-28 MED ORDER — PHENYLEPHRINE HCL 10 MG/ML IJ SOLN
INTRAMUSCULAR | Status: DC | PRN
  Administered 2018-02-28 (×6): 80 ug via INTRAVENOUS

## 2018-02-28 MED ORDER — HYDROMORPHONE HCL 2 MG PO TABS
2.0000 mg | ORAL_TABLET | ORAL | Status: DC | PRN
  Administered 2018-03-01: 2 mg via ORAL
  Filled 2018-02-28: qty 1

## 2018-02-28 MED ORDER — SENNOSIDES-DOCUSATE SODIUM 8.6-50 MG PO TABS
2.0000 | ORAL_TABLET | ORAL | Status: DC
  Administered 2018-02-28 – 2018-03-01 (×2): 2 via ORAL
  Filled 2018-02-28 (×2): qty 2

## 2018-02-28 MED ORDER — ONDANSETRON HCL 4 MG/2ML IJ SOLN
INTRAMUSCULAR | Status: DC | PRN
  Administered 2018-02-28: 4 mg via INTRAVENOUS

## 2018-02-28 MED ORDER — MEASLES, MUMPS & RUBELLA VAC ~~LOC~~ INJ: 0.5000 mL | INJECTION | Freq: Once | SUBCUTANEOUS | Status: DC

## 2018-02-28 MED ORDER — WITCH HAZEL-GLYCERIN EX PADS: 1.0000 "application " | MEDICATED_PAD | CUTANEOUS | Status: DC | PRN

## 2018-02-28 MED ORDER — PRENATAL MULTIVITAMIN CH: 1.0000 | ORAL_TABLET | Freq: Every day | ORAL | Status: DC

## 2018-02-28 MED ORDER — ACETAMINOPHEN 325 MG PO TABS: 325.0000 mg | ORAL_TABLET | ORAL | Status: DC | PRN

## 2018-02-28 MED ORDER — LABETALOL HCL 200 MG PO TABS
200.0000 mg | ORAL_TABLET | Freq: Two times a day (BID) | ORAL | Status: DC
  Administered 2018-02-28 – 2018-03-02 (×5): 200 mg via ORAL
  Filled 2018-02-28 (×5): qty 1

## 2018-02-28 MED ORDER — FENTANYL CITRATE (PF) 100 MCG/2ML IJ SOLN: 25.0000 ug | INTRAMUSCULAR | Status: DC | PRN

## 2018-02-28 MED ORDER — SODIUM CHLORIDE 0.9 % IR SOLN
Status: DC | PRN
  Administered 2018-02-28: 1

## 2018-02-28 MED ORDER — OXYTOCIN 40 UNITS IN LACTATED RINGERS INFUSION - SIMPLE MED: 2.5000 [IU]/h | INTRAVENOUS | Status: AC

## 2018-02-28 MED ORDER — TETANUS-DIPHTH-ACELL PERTUSSIS 5-2.5-18.5 LF-MCG/0.5 IM SUSP: 0.5000 mL | Freq: Once | INTRAMUSCULAR | Status: DC

## 2018-02-28 MED ORDER — PRENATAL MULTIVITAMIN CH
1.0000 | ORAL_TABLET | Freq: Every day | ORAL | Status: DC
  Administered 2018-03-01 – 2018-03-02 (×2): 1 via ORAL
  Filled 2018-02-28 (×2): qty 1

## 2018-02-28 MED ORDER — MORPHINE SULFATE (PF) 0.5 MG/ML IJ SOLN
INTRAMUSCULAR | Status: DC | PRN
  Administered 2018-02-28: .15 mg via INTRATHECAL

## 2018-02-28 MED ORDER — LACTATED RINGERS IV SOLN
INTRAVENOUS | Status: DC
  Administered 2018-02-28: 20:00:00 via INTRAVENOUS

## 2018-02-28 MED ORDER — MORPHINE SULFATE (PF) 0.5 MG/ML IJ SOLN
INTRAMUSCULAR | Status: AC
  Filled 2018-02-28: qty 10

## 2018-02-28 MED ORDER — PHENYLEPHRINE 8 MG IN D5W 100 ML (0.08MG/ML) PREMIX OPTIME
INJECTION | INTRAVENOUS | Status: DC | PRN
  Administered 2018-02-28: 60 ug/min via INTRAVENOUS

## 2018-02-28 SURGICAL SUPPLY — 28 items
APL SKNCLS STERI-STRIP NONHPOA (GAUZE/BANDAGES/DRESSINGS) ×1
BENZOIN TINCTURE PRP APPL 2/3 (GAUZE/BANDAGES/DRESSINGS) ×2 IMPLANT
CHLORAPREP W/TINT 26ML (MISCELLANEOUS) ×2 IMPLANT
CLAMP CORD UMBIL (MISCELLANEOUS) IMPLANT
CLIP FILSHIE TUBAL LIGA STRL (Clip) ×2 IMPLANT
CLOSURE STERI STRIP 1/2 X4 (GAUZE/BANDAGES/DRESSINGS) ×2 IMPLANT
CLOTH BEACON ORANGE TIMEOUT ST (SAFETY) ×2 IMPLANT
DRSG OPSITE POSTOP 4X10 (GAUZE/BANDAGES/DRESSINGS) ×2 IMPLANT
ELECT REM PT RETURN 9FT ADLT (ELECTROSURGICAL) ×2
ELECTRODE REM PT RTRN 9FT ADLT (ELECTROSURGICAL) ×1 IMPLANT
EXTRACTOR VACUUM M CUP 4 TUBE (SUCTIONS) IMPLANT
GLOVE BIOGEL PI IND STRL 7.0 (GLOVE) ×1 IMPLANT
GLOVE BIOGEL PI INDICATOR 7.0 (GLOVE) ×1
GLOVE ECLIPSE 7.0 STRL STRAW (GLOVE) ×4 IMPLANT
GOWN STRL REUS W/TWL LRG LVL3 (GOWN DISPOSABLE) ×4 IMPLANT
KIT ABG SYR 3ML LUER SLIP (SYRINGE) IMPLANT
NEEDLE HYPO 25X5/8 SAFETYGLIDE (NEEDLE) IMPLANT
NS IRRIG 1000ML POUR BTL (IV SOLUTION) ×2 IMPLANT
PACK C SECTION WH (CUSTOM PROCEDURE TRAY) ×2 IMPLANT
PAD OB MATERNITY 4.3X12.25 (PERSONAL CARE ITEMS) ×2 IMPLANT
SPONGE LAP 18X18 RF (DISPOSABLE) ×6 IMPLANT
SUT MNCRL 0 VIOLET CTX 36 (SUTURE) ×4 IMPLANT
SUT MON AB 2-0 CT1 27 (SUTURE) ×4 IMPLANT
SUT MONOCRYL 0 CTX 36 (SUTURE) ×4
SUT PLAIN 0 NONE (SUTURE) IMPLANT
SUT PLAIN 2 0 XLH (SUTURE) ×2 IMPLANT
TOWEL OR 17X24 6PK STRL BLUE (TOWEL DISPOSABLE) ×2 IMPLANT
TRAY FOLEY W/BAG SLVR 14FR LF (SET/KITS/TRAYS/PACK) IMPLANT

## 2018-02-28 NOTE — Op Note (Signed)
NAME: Zoe Evans, SOLDO MEDICAL RECORD BJ:[id number] ACCOUNT [id number] DATE OF BIRTH:18-Jun-1985 FACILITY: [facility] LOCATION: [facility] PHYSICIAN:Tayden Duran Orvilla Cornwall, MD  OPERATIVE REPORT  DATE OF PROCEDURE:  02/28/2018  PREOPERATIVE DIAGNOSES: 1.  Intrauterine pregnancy at term. 2.  History of prior cesarean section x2. 3.  The patient desires permanent sterilization.  POSTOPERATIVE DIAGNOSES:   1.  Intrauterine pregnancy at term. 2.  History of prior cesarean section x2. 3.  The patient desires permanent sterilization.  PROCEDURE:  Repeat low transverse cesarean section and bilateral tubal ligation with Filshie clips.  SURGEON:  Malva Limes, MD  ANESTHESIA:  Spinal.  ANTIBIOTICS:  Ancef 2 grams.  DRAINS:  Foley, bedside drainage.  ESTIMATED BLOOD LOSS:  900 mL  SPECIMENS:  None.  FINDINGS:  The patient had normal fallopian tubes and ovaries bilaterally.  The placenta was anteriorly.  The infant was delivered in the vertex presentation.  PROCEDURE:  The patient was taken to the operating room where a spinal anesthetic was administered without difficulty.  She was then placed in the dorsal supine position with a left lateral tilt.  She was prepped and draped in the usual fashion for this  procedure.  A Pfannenstiel incision was made through a previous scar entering the abdominal cavity.  The bladder flap was taken down with sharp dissection.  A low transverse uterine incision was made in the midline with the Metzenbaum scissors and  extended laterally with blunt dissection.  Amniotic fluid was noted to be clear.  The infant was delivered in the vertex presentation.  Oropharynx and nostril were bulb suctioned.  The remaining infant was then delivered.  The cord was doubly clamped and  cut and the infant handed to the awaiting NICU team.  The placenta was then manually removed.  The uterus was exteriorized.  The uterine cavity was wiped with a wet lap and examined.  The uterine  incision was closed in a single layer of 0 Monocryl  suture in a running locking fashion.  The bladder flap was closed using 2-0 Monocryl in a running fashion.  At this point, the patient was asked again if she wanted tubal ligation.  She confirmed.  A Filshie clip was placed on the right fallopian tube in  the isthmic portion.  The clip was placed perpendicular to the tube.  The tube was entirely within the clasp.  The clasp appeared to be tightly closed.  A similar procedure was performed on the opposite side.  Following this, hemostasis was rechecked  and felt to be adequate.  The uterus was placed back in the abdominal cavity.  The parietal peritoneum and rectus muscles were reapproximated in the midline using 2-0 Monocryl in a running fashion.  The fascia was closed using 0 Monocryl suture in a  running fashion.  The subcuticular tissue was irrigated and made hemostatic with the Bovie and then closed with interrupted 2-0 plain gut suture.  The skin was closed with 3-0 Vicryl in a subcuticular fashion.  Steri-Strips were then placed.  The patient  was taken to recovery room in stable condition.  Instrument and lap counts correct x3.  TN/NUANCE  D:02/28/2018 T:02/28/2018 JOB:[id number]

## 2018-02-28 NOTE — Progress Notes (Signed)
MOB was referred for history of depression/anxiety. * Referral screened out by Clinical Social Worker because none of the following criteria appear to apply: ~ History of anxiety/depression during this pregnancy, or of post-partum depression following prior delivery. ~ Diagnosis of anxiety and/or depression within last 3 years OR * MOB's symptoms currently being treated with medication and/or therapy. Please contact the Clinical Social Worker if needs arise, by MOB request, or if MOB scores greater than 9/yes to question 10 on Edinburgh Postpartum Depression Screen.  Trecia Maring, LCSW Clinical Social Worker  [facility]  [phone]  

## 2018-02-28 NOTE — Lactation Note (Signed)
This note was copied from a baby's chart. Lactation Consultation Note  Patient Name: Zoe Evans ZOXWR'U Date: 02/28/2018 Reason for consult: Initial assessment;Term P2, 8 hours female , term infant. Per mom, has medela DEBP at home. Parents have been doing STS. Per mom, BF is going well with 2nd child, infant BF well in L &D, infant has been latching without problems, mom BF 20 minutes prior to [facility] entering the room. LC unable to observe latch at this time. Mom demonstrated hand expression, LC noticed no trauma to nipples, nipples are everted and mom hand expressed 6 ml of colostrum that will be given to infant in a spoon at next feeding. Mom's goal is to BF longer this time she only BF her son 6 weeks due issues with latching and him being a LGA infant. LC dicusssed I&O. LC discussed hunger cues, BF 8 to 12 times within 24 hours including nights. Mom made aware of O/P services, breastfeeding support groups, community resources, and our phone # for post-discharge questions.  Maternal Data Formula Feeding for Exclusion: No Has patient been taught Hand Expression?: Yes(Mom demostrated hand expression and expressed 6 ml of colostrum.) Does the patient have breastfeeding experience prior to this delivery?: Yes  Feeding Feeding Type: Breast Fed Length of feed: 20 min  LATCH Score Latch: Grasps breast easily, tongue down, lips flanged, rhythmical sucking.  Audible Swallowing: A few with stimulation  Type of Nipple: Everted at rest and after stimulation  Comfort (Breast/Nipple): Engorged, cracked, bleeding, large blisters, severe discomfort  Hold (Positioning): Assistance needed to correctly position infant at breast and maintain latch.  LATCH Score: 6  Interventions Interventions: Breast feeding basics reviewed;Hand express;Expressed milk;Breast compression  Lactation Tools Discussed/Used WIC Program: No   Consult Status Consult Status: Follow-up Date:  03/01/18 Follow-up type: In-patient    Danelle Earthly 02/28/2018, 9:14 PM

## 2018-02-28 NOTE — Progress Notes (Signed)
Pts BP 141/94, Dr. Dareen Piano notified and orders received to administer a second dose of Labetolol 200mg  now in addition to evening scheduled dose. Pt took her own medication this am.

## 2018-02-28 NOTE — Anesthesia Preprocedure Evaluation (Addendum)
Anesthesia Evaluation  Patient identified by MRN, date of birth, ID band Patient awake    Reviewed: Allergy & Precautions, H&P , NPO status , Patient's Chart, lab work & pertinent test results, reviewed documented beta blocker date and time   Airway Mallampati: II  TM Distance: >3 FB Neck ROM: full    Dental no notable dental hx.    Pulmonary neg pulmonary ROS,    Pulmonary exam normal breath sounds clear to auscultation       Cardiovascular hypertension, Pt. on medications and Pt. on home beta blockers negative cardio ROS Normal cardiovascular exam Rhythm:regular Rate:Normal     Neuro/Psych negative neurological ROS  negative psych ROS   GI/Hepatic negative GI ROS, Neg liver ROS,   Endo/Other  negative endocrine ROS  Renal/GU negative Renal ROS  negative genitourinary   Musculoskeletal   Abdominal   Peds  Hematology negative hematology ROS (+)   Anesthesia Other Findings   Reproductive/Obstetrics (+) Pregnancy                             Anesthesia Physical Anesthesia Plan  ASA: II  Anesthesia Plan: Spinal   Post-op Pain Management:    Induction:   PONV Risk Score and Plan: 2 and Ondansetron and Treatment may vary due to age or medical condition  Airway Management Planned: Nasal Cannula  Additional Equipment:   Intra-op Plan:   Post-operative Plan:   Informed Consent: I have reviewed the patients History and Physical, chart, labs and discussed the procedure including the risks, benefits and alternatives for the proposed anesthesia with the patient or authorized representative who has indicated his/her understanding and acceptance.     Plan Discussed with: Anesthesiologist, CRNA and Surgeon  Anesthesia Plan Comments:         Anesthesia Quick Evaluation

## 2018-02-28 NOTE — Anesthesia Procedure Notes (Signed)
Spinal  Patient location during procedure: OR Start time: 02/28/2018 11:59 AM End time: 02/28/2018 12:04 PM Staffing Anesthesiologist: Bethena Midget, MD Preanesthetic Checklist Completed: patient identified, site marked, surgical consent, pre-op evaluation, timeout performed, IV checked, risks and benefits discussed and monitors and equipment checked Spinal Block Patient position: sitting Prep: DuraPrep Patient monitoring: heart rate, cardiac monitor, continuous pulse ox and blood pressure Approach: midline Location: L3-4 Injection technique: single-shot Needle Needle type: Sprotte  Needle gauge: 24 G Needle length: 9 cm Assessment Sensory level: T4

## 2018-02-28 NOTE — H&P (Signed)
Zoe Evans is an 32 y.o. G3P1001 [redacted]w[redacted]d white female who presents for an  repeat c/s. She has had two proir c/s.  PNC was complicated by [facility] . She is on labetalol 200 bid and asa. Declined genetic tesitng. GBS neg OGTT neg Chief Complaint: HPI:  Past Medical History:  Diagnosis Date  . Anxiety   . Back pain   . Depression   . Hearing loss in left ear   . HPV (human papilloma virus) anogenital infection   . Hypertension   . Vaginal Pap smear, abnormal     Past Surgical History:  Procedure Laterality Date  . CESAREAN SECTION    . CESAREAN SECTION      Family History  Problem Relation Age of Onset  . Hypertension Mother   . Cancer Maternal Aunt   . Cancer Maternal Grandmother   . Cancer Paternal Grandmother   . Breast cancer Maternal Aunt 30       3 maternal cousins as well  . Breast cancer Maternal Grandmother 40  . Breast cancer Cousin   . Breast cancer Cousin   . Breast cancer Cousin    Social History:  reports that she has never smoked. She has never used smokeless tobacco. She reports that she drank alcohol. She reports that she does not use drugs.  Allergies:  Allergies  Allergen Reactions  . Ace Inhibitors Cough  . Codeine Nausea And Vomiting and Other (See Comments)    "liquid codeine"    Medications Prior to Admission  Medication Sig Dispense Refill  . cetirizine (ZYRTEC) 10 MG tablet Take 10 mg by mouth daily.    . citalopram (CELEXA) 20 MG tablet TAKE 1 & 1/2 (ONE & ONE-HALF) TABLETS BY MOUTH ONCE DAILY (Patient taking differently: Take 10 mg by mouth daily. ) 135 tablet 2  . labetalol (NORMODYNE) 200 MG tablet Take 200 mg by mouth 2 (two) times daily.    Marland Kitchen omeprazole (PRILOSEC) 20 MG capsule Take 20 mg by mouth daily.  1  . Prenatal Vit-Fe Fumarate-FA (PRENATAL MULTIVITAMIN) TABS tablet Take 1 tablet by mouth daily.     . ranitidine (ZANTAC) 150 MG tablet Take 150 mg by mouth 2 (two) times daily.    . valACYclovir (VALTREX) 1000 MG tablet TAKE 1  TABLET BY MOUTH ONCE DAILY (Patient taking differently: Take 1,000 mg by mouth daily. ) 30 tablet 5  . amLODipine (NORVASC) 2.5 MG tablet TAKE 1 TABLET BY MOUTH ONCE DAILY (Patient not taking: No sig reported) 30 tablet 2       Last menstrual period 05/29/2017. General appearance: alert and cooperative Abdomen: gravid non tender   Lab Results  Component Value Date   WBC 10.5 02/27/2018   HGB 11.5 (L) 02/27/2018   HCT 34.2 (L) 02/27/2018   MCV 85.7 02/27/2018   PLT 243 02/27/2018   Lab Results  Component Value Date   PREGTESTUR POSITIVE (A) 07/20/2017       Patient Active Problem List   Diagnosis Date Noted  . Essential hypertension 04/05/2016  . Migraine headache 08/09/2014  . Depression with anxiety 07/01/2013   IMP/ IUP at term with 2 prior c/s Plan/ To OR for repeat c/s Genee Rann E 02/28/2018, 11:06 AM

## 2018-02-28 NOTE — Transfer of Care (Signed)
Immediate Anesthesia Transfer of Care Note  Patient: Zoe Evans  Procedure(s) Performed: CESAREAN SECTION (N/A Abdomen)  Patient Location: PACU  Anesthesia Type:Spinal  Level of Consciousness: awake, alert  and oriented  Airway & Oxygen Therapy: Patient Spontanous Breathing and Patient connected to nasal cannula oxygen  Post-op Assessment: Report given to RN and Post -op Vital signs reviewed and stable  Post vital signs: Reviewed and stable  Last Vitals:  Vitals Value Taken Time  BP    Temp    Pulse    Resp    SpO2      Last Pain:  Vitals:   02/28/18 1107  TempSrc: Oral  PainSc: 0-No pain      Patients Stated Pain Goal: 4 (02/28/18 1107)  Complications: No apparent anesthesia complications

## 2018-02-28 NOTE — Anesthesia Postprocedure Evaluation (Signed)
Anesthesia Post Note  Patient: ISSA LUSTER  Procedure(s) Performed: CESAREAN SECTION (N/A Abdomen)     Patient location during evaluation: PACU Anesthesia Type: Spinal Level of consciousness: oriented and awake and alert Pain management: pain level controlled Vital Signs Assessment: post-procedure vital signs reviewed and stable Respiratory status: spontaneous breathing, respiratory function stable and patient connected to nasal cannula oxygen Cardiovascular status: blood pressure returned to baseline and stable Postop Assessment: no headache, no backache and no apparent nausea or vomiting Anesthetic complications: no    Last Vitals:  Vitals:   02/28/18 1500 02/28/18 1511  BP: (!) 131/91 134/86  Pulse: 84 77  Resp: 16 18  Temp:  36.9 C  SpO2: 99% 98%    Last Pain:  Vitals:   02/28/18 1511  TempSrc: Oral  PainSc:    Pain Goal: Patients Stated Pain Goal: 0 (02/28/18 1315)               Lawton Dollinger

## 2018-03-01 LAB — CBC
HCT: 27.2 % — ABNORMAL LOW (ref 36.0–46.0)
HEMOGLOBIN: 9.3 g/dL — AB (ref 12.0–15.0)
MCH: 29.2 pg (ref 26.0–34.0)
MCHC: 34.2 g/dL (ref 30.0–36.0)
MCV: 85.3 fL (ref 78.0–100.0)
PLATELETS: 227 10*3/uL (ref 150–400)
RBC: 3.19 MIL/uL — AB (ref 3.87–5.11)
RDW: 12.7 % (ref 11.5–15.5)
WBC: 12.2 10*3/uL — ABNORMAL HIGH (ref 4.0–10.5)

## 2018-03-01 MED ORDER — HYDROMORPHONE HCL 1 MG/ML IJ SOLN
2.0000 mg | Freq: Once | INTRAMUSCULAR | Status: AC
  Administered 2018-03-01: 2 mg via INTRAVENOUS
  Filled 2018-03-01: qty 2

## 2018-03-01 MED ORDER — DIPHENHYDRAMINE HCL 25 MG PO CAPS
50.0000 mg | ORAL_CAPSULE | Freq: Four times a day (QID) | ORAL | Status: DC | PRN
  Administered 2018-03-01: 50 mg via ORAL
  Filled 2018-03-01: qty 2

## 2018-03-01 MED ORDER — OXYCODONE-ACETAMINOPHEN 5-325 MG PO TABS
1.0000 | ORAL_TABLET | ORAL | Status: DC | PRN
  Administered 2018-03-01 – 2018-03-02 (×6): 1 via ORAL
  Filled 2018-03-01 (×7): qty 1

## 2018-03-01 NOTE — Addendum Note (Signed)
Addendum  created 03/01/18 0806 by Junious Silk, CRNA   Sign clinical note

## 2018-03-01 NOTE — Progress Notes (Signed)
  Patient is eating, ambulating, voiding.  Pain control is good.  Vitals:   03/01/18 0012 03/01/18 0028 03/01/18 0445 03/01/18 0500  BP: 126/68 128/83 (!) 143/101 (!) 136/96  Pulse: 92 (!) 104 87 85  Resp:      Temp:   97.7 F (36.5 C)   TempSrc:      SpO2:        lungs:   clear to auscultation cor:    RRR Abdomen:  soft, appropriate tenderness, incisions intact and without erythema or exudate ex:    no cords   Lab Results  Component Value Date   WBC 12.2 (H) 03/01/2018   HGB 9.3 (L) 03/01/2018   HCT 27.2 (L) 03/01/2018   MCV 85.3 03/01/2018   PLT 227 03/01/2018    --/--/B POS (09/30 1140)/RI  A/P    Post operative day 1.  Routine post op and postpartum care.  Expect d/c tomorrow.  Percocet for pain control. Iron for anemia.    BPs slightly elevated today- will watch.

## 2018-03-01 NOTE — Progress Notes (Signed)
Called by nursing service at [phone] stating that pt was having new onset of left shoulder pain. Pt states that pain began approximately one hour ago. Pain worse when she lays down. She has only had Tylenol for pain relief to date. She has no c/o abdominal pain/flank pain/SOB. She states pain is worse when she takes a deep breath. Pt was given Dilaudid and pain is resolving.  PE: Not hypotensive        Abd -soft/nondistended/no rebound or guarding        Dressing dry        No CVAT        Pain increased with movement of arm or deep inspiration IMP/ Diaphragmatic irritation from blood or air Plan/ Check CBC           Pain med           Ambulation           Follow

## 2018-03-01 NOTE — Lactation Note (Addendum)
This note was copied from a baby's chart. Lactation Consultation Note  Patient Name: Zoe Evans ZOXWR'U Date: 03/01/2018 Reason for consult: Follow-up assessment;Term;Infant weight loss P2, 32 hours old, weight loss -3% Per mom, infant  Started cluster feeding and she was very tired so she has been supplementing with formula currently, this is family's choice they have been educated on LEAD. Per mom,  currently she has been latching infant to breast 10-15 minutes, then supplementing with 15 ml of powder Enfamil gentlease 20 kcal with iron. This is the formula she used with her son. Infant was cuing as LC in room, mom latched infant to right breast in cross-cradle hold, infant top lip fringed out and lower jaw extended swallows heard by LC. Latch -9. Mom feels infant latches well but she prefers to supplement with formula at this time.  Mom is pumping every 3 hours for 15 minutes for breast stimulation and induction. Mom will give infant any EBM back first that she pumps before offering formula.   Mom's current goals: 1. BF  Infant according hunger cues, 8 to 12 times within 24 hours. 2. Plans to pump every 3 hours for 15 minutes and give infant by EBM before offering the formula. 3. Been supplement  with formula this is the parent's choice with 15 mls of Enfamil gentlease  20 kcal with iron after every feeding.   Maternal Data    Feeding Feeding Type: Formula  LATCH Score Latch: Grasps breast easily, tongue down, lips flanged, rhythmical sucking.  Audible Swallowing: Spontaneous and intermittent  Type of Nipple: Everted at rest and after stimulation  Comfort (Breast/Nipple): Soft / non-tender  Hold (Positioning): Assistance needed to correctly position infant at breast and maintain latch.  LATCH Score: 9  Interventions Interventions: Assisted with latch;Hand express  Lactation Tools Discussed/Used     Consult Status Consult Status: Follow-up Date:  03/02/18 Follow-up type: In-patient    Danelle Earthly 03/01/2018, 9:08 PM

## 2018-03-01 NOTE — Anesthesia Postprocedure Evaluation (Signed)
Anesthesia Post Note  Patient: Zoe Evans  Procedure(s) Performed: CESAREAN SECTION (N/A Abdomen)     Patient location during evaluation: PACU Anesthesia Type: Spinal Level of consciousness: oriented and awake and alert Pain management: pain level controlled Vital Signs Assessment: post-procedure vital signs reviewed and stable Respiratory status: spontaneous breathing, respiratory function stable and patient connected to nasal cannula oxygen Cardiovascular status: blood pressure returned to baseline and stable Postop Assessment: no headache, no backache and no apparent nausea or vomiting Anesthetic complications: no    Last Vitals:  Vitals:   03/01/18 0445 03/01/18 0500  BP: (!) 143/101 (!) 136/96  Pulse: 87 85  Resp:    Temp: 36.5 C   SpO2:      Last Pain:  Vitals:   03/01/18 0600  TempSrc:   PainSc: Asleep   Pain Goal: Patients Stated Pain Goal: 0 (02/28/18 1315)               Junious Silk

## 2018-03-02 MED ORDER — OXYCODONE HCL 5 MG PO TABS: 5.0000 mg | ORAL_TABLET | ORAL | 0 refills | Status: DC | PRN

## 2018-03-02 NOTE — Progress Notes (Addendum)
Patient is doing well.  She is tolerating PO, ambulating, voiding.  Pain is controlled.  Lochia is appropriate. Shoulder pain has resolved.    Vitals:   03/01/18 1454 03/01/18 1459 03/01/18 2256 03/02/18 0503  BP: 131/90 128/80 139/89 136/87  Pulse: 89 89 83 77  Resp: 18  18 18   Temp: 98.4 F (36.9 C)  98.2 F (36.8 C) 98 F (36.7 C)  TempSrc: Oral  Oral Oral  SpO2:   99%     NAD Abdomen:  soft, appropriate tenderness, incisions intact and without erythema ext:    Symmetric, trace edema bilaterally  Lab Results  Component Value Date   WBC 12.2 (H) 03/01/2018   HGB 9.3 (L) 03/01/2018   HCT 27.2 (L) 03/01/2018   MCV 85.3 03/01/2018   PLT 227 03/01/2018    --/--/B POS (09/30 1140)/RI  A/P    32 y.o. G3P2002 POD 2 s/p RCS and BTL Routine post op and postpartum care.   Meeting all goals, desires d/c to home today ABLA--PNV w iron at discharge Continue labetalol 200mg  BID for CHTN.  Baselines bps 120/80-90s.  Slightly above baseline, but asymptomatic.  Will check BP 2-3x/day at home and return to office for short interval bp check next week

## 2018-03-02 NOTE — Discharge Instructions (Signed)
You may wash incision with soap and water.   Do not soak the incision for 2 weeks (no tub baths or swimming).   Keep incision dry. You may need to keep a sanitary pad or panty liner between the incision and your clothing for comfort and to keep the incision dry.  If you note drainage, increased pain, or increased redness of the incision, then please notify your physician.  Pelvic rest x 6 weeks (no intercourse or tampons)   No lifting over 10 lbs for 6 weeks.   Do not drive until you are not taking narcotic pain medication AND you can comfortably slam on the brakes.  You have a mild anemia due to blood loss from delivery.  Please continue to take a prenatal vitamin daily.  You may experience constipation from the iron, so add a stool softener as needed.  Please check your blood pressure twice daily and call if > 140 or >90 on two occassions

## 2018-03-02 NOTE — Lactation Note (Signed)
This note was copied from a baby's chart. Lactation Consultation Note  Patient Name: Zoe Evans [id number] Date: 03/02/2018 Reason for consult: Follow-up assessment;Infant weight loss;Term  Early D/C , baby is 94 hours old, Bili check - 6.3 at 34 hours , 4 %   As LC entered the room , mom packing up for D/C.  Per mom baby recently breast fed/ and supplemented and that is her plan.  Mom is experienced BF x 1 for 6 weeks, denies soreness and feels milk is  Coming in. Sore nipple and engorgement prevention and tx reviewed.  Per mom has a [place] and a DEBP Medela at home.  Per mom when milk comes in I want my baby to get breast milk.  LC recommended to mom since the baby's weight loss is low,  If the baby is content after feeding at the breast and her breast are fuller.  To hold off on the supplementing.  LC discussed supply and demand and the importance of breast stimulation  At least 8 x's a day to establish milk supply.  Mother informed of post-discharge support and given phone number to the lactation department, including services for phone call assistance; out-patient appointments; and breastfeeding support group. List of other breastfeeding resources in the community given in the handout. Encouraged mother to call for problems or concerns related to breastfeeding.     Maternal Data    Feeding Feeding Type: (baby recently fed - presently sleeping ) Nipple Type: Slow - flow  LATCH Score                   Interventions Interventions: Breast feeding basics reviewed  Lactation Tools Discussed/Used Pump Review: Milk Storage Initiated by:: MAI  Date initiated:: 03/02/18   Consult Status Consult Status: Complete Date: 03/02/18    Zoe Evans Zoe Evans 03/02/2018, 11:41 AM

## 2018-03-02 NOTE — Discharge Summary (Signed)
Obstetric Discharge Summary Reason for Admission: cesarean section Prenatal Procedures: none Intrapartum Procedures: cesarean: low cervical, transverse and tubal ligation Postpartum Procedures: none Complications-Operative and Postpartum: none Hemoglobin  Date Value Ref Range Status  03/01/2018 9.3 (L) 12.0 - 15.0 g/dL Final  16/03/9603 54.0 11.1 - 15.9 g/dL Final   HCT  Date Value Ref Range Status  03/01/2018 27.2 (L) 36.0 - 46.0 % Final   Hematocrit  Date Value Ref Range Status  02/07/2017 39.3 34.0 - 46.6 % Final    Physical Exam:  General: alert, cooperative and appears stated age 32: appropriate Uterine Fundus: firm Incision: healing well, no significant drainage DVT Evaluation: No evidence of DVT seen on physical exam.  Discharge Diagnoses: Term Pregnancy-delivered  Discharge Information: Date: 03/02/2018 Activity: pelvic rest Diet: routine Medications: PNV, Ibuprofen and oxycodone, labetalol Condition: stable Instructions: refer to practice specific booklet Discharge to: home Follow-up Information    Levi Aland, MD Follow up in 4 week(s).   Specialty:  Obstetrics and Gynecology Why:  Please also call for BP check next week in office Contact information: [address] [place] [address] [phone]           Newborn Data: Live born female  Birth Weight: 7 lb 2.6 oz (3249 g) APGAR: 8, 9  Newborn Delivery   Birth date/time:  02/28/2018 12:28:00 Delivery type:  C-Section, Low Transverse Trial of labor:  No C-section categorization:  Repeat     Home with mother.  Cheyann Blecha GEFFEL Walterine Amodei 03/02/2018, 8:49 AM

## 2018-04-19 ENCOUNTER — Telehealth: Payer: Self-pay | Admitting: Family Medicine

## 2018-04-19 NOTE — Telephone Encounter (Signed)
Pharmacy requesting refill on Valacyclovir 1 gm tab. Take one tablet by mouth daily

## 2018-04-20 ENCOUNTER — Other Ambulatory Visit: Payer: Self-pay

## 2018-04-20 MED ORDER — VALACYCLOVIR HCL 1 G PO TABS: 1000.0000 mg | ORAL_TABLET | Freq: Every day | ORAL | 5 refills | Status: DC

## 2018-04-20 NOTE — Telephone Encounter (Signed)
Ok ref times six 

## 2018-04-20 NOTE — Telephone Encounter (Signed)
Medication sent to pharmacy  

## 2018-04-21 ENCOUNTER — Encounter: Payer: Self-pay | Admitting: Family Medicine

## 2018-06-01 ENCOUNTER — Encounter: Payer: Self-pay | Admitting: Family Medicine

## 2018-06-01 ENCOUNTER — Ambulatory Visit (INDEPENDENT_AMBULATORY_CARE_PROVIDER_SITE_OTHER): Payer: BC Managed Care – PPO | Admitting: Family Medicine

## 2018-06-01 VITALS — BP 140/100 | Temp 98.5°F | Wt 212.8 lb

## 2018-06-01 DIAGNOSIS — I10 Essential (primary) hypertension: Secondary | ICD-10-CM | POA: Diagnosis not present

## 2018-06-01 DIAGNOSIS — M542 Cervicalgia: Secondary | ICD-10-CM | POA: Diagnosis not present

## 2018-06-01 DIAGNOSIS — M5441 Lumbago with sciatica, right side: Secondary | ICD-10-CM | POA: Diagnosis not present

## 2018-06-01 MED ORDER — LABETALOL HCL 200 MG PO TABS: 400.0000 mg | ORAL_TABLET | Freq: Two times a day (BID) | ORAL | 0 refills | Status: DC

## 2018-06-01 MED ORDER — PREDNISONE 20 MG PO TABS: ORAL_TABLET | ORAL | 0 refills | Status: DC

## 2018-06-01 NOTE — Progress Notes (Signed)
Subjective:    Patient ID: Zoe Evans, female    DOB: 13-Apr-1986, 33 y.o.   MRN: [id number]  Neck Pain   This is a new problem. The pain is present in the right side. The quality of the pain is described as shooting. Stiffness is present in the morning. Associated symptoms include weakness. Pertinent negatives include no chest pain, fever or numbness. Associated symptoms comments: Ear pain; pt also is having back pain, leg pain, and hip pain. Hip and leg pain started about 2 months ago. Lower back pain assoc with neck pain. . She has tried acetaminophen and heat (elevation) for the symptoms. The treatment provided mild relief.   Reports 2 month hx of right back, leg and hip pain. Pain is constant, described as sore and throbbing. Denies any injury. 3 months ago had c-section, planned, complicated by high BP and had to stay in the hospital for blood loss. Denies any numbness or tingling to legs. Feels like when she first stands right leg feels weaker, but then is able to ambulate without problem. No swelling. Reports pain to leg and hip wakes her up at night.  2 week hx of right-sided neck pain worse with neck movement, feels like a strained muscle, pain sometimes shoots to the middle of her back and feels like it pulsates, occurs at least twice a week lasts about 1-2 hours. Pain worse in the mornings when she wakes up. Massage helps ease pain. Denies numbness or tingling to bilateral arms. Denies weakness. Reports headache around entire head, happens simultaneously with neck pain, dependent on neck movements. Denies N/V, chest pain or shortness of breath.   Has tried tylenol and motrin, helps somewhat. Heating pad helps.   Currently taking labetalol 200 mg bid for elevated BP, was being prescribed by OBGYN, however does not have any f/u appts scheduled. Reports hx of HTN prior to pregnancy. Is currently breastfeeding. Reports home BP readings have been elevated as well.   Review of Systems    Constitutional: Negative for fever and unexpected weight change.  Eyes: Negative for visual disturbance.  Respiratory: Negative for shortness of breath.   Cardiovascular: Negative for chest pain.  Gastrointestinal: Negative for nausea and vomiting.  Genitourinary: Negative for difficulty urinating.  Musculoskeletal: Positive for back pain and neck pain.  Neurological: Positive for weakness. Negative for numbness.       Objective:   Physical Exam Vitals signs and nursing note reviewed.  Constitutional:      General: She is not in acute distress.    Appearance: She is well-developed.  HENT:     Head: Normocephalic and atraumatic.  Neck:     Musculoskeletal: Neck supple.  Cardiovascular:     Rate and Rhythm: Normal rate and regular rhythm.     Heart sounds: Normal heart sounds. No murmur.  Pulmonary:     Effort: Pulmonary effort is normal. No respiratory distress.     Breath sounds: Normal breath sounds.  Musculoskeletal:     Comments: Neck: No c-spine tenderness, tender to palpation over right-sided musculature. Pain to right-side of neck with ROM. Grip strength equal bilaterally, upper extremity DTRs symmetrical bilaterally.   Back: Tender to palpation over lower back paraspinal muscles, worse on right side. Pain radiates down right lateral leg. Sensation intact bilaterally. Patellar DTR to right leg is more brisk than left leg. Right leg with decreased strength in comparison to left with plantar flexion and big toe dorsiflexion. SLR positive on right side, negative on  left side.   Skin:    General: Skin is warm and dry.  Neurological:     Mental Status: She is alert and oriented to person, place, and time.           Assessment & Plan:  1. Essential hypertension BP elevated on repeat in office today, pt with hx of elevated BP at home. With current breastfeeding status recommend increasing labetalol to 400 mg bid. Warning signs discussed. Healthy diet and exercise  encouraged. F/u in 2 weeks.   2. Neck pain See #3 plan  3. Acute right-sided low back pain with right-sided sciatica Likely inflamed nerve root, will treat with prednisone taper, gentle back stretches as tolerated. Discussed avoiding other anti-inflammatory medications while on prednisone, may use tylenol prn. Medications should also help with neck pain, gentle stretching recommended. Warning signs discussed. F/u in 2 weeks with Dr. Brett CanalesSteve.   Dr. Lubertha SouthSteve Luking was consulted on this case and is in agreement with the above treatment plan.

## 2018-06-01 NOTE — Patient Instructions (Signed)

## 2018-06-04 ENCOUNTER — Encounter: Payer: Self-pay | Admitting: Family Medicine

## 2018-06-13 ENCOUNTER — Ambulatory Visit: Payer: BC Managed Care – PPO | Admitting: Family Medicine

## 2018-06-27 ENCOUNTER — Ambulatory Visit: Payer: BC Managed Care – PPO | Admitting: Family Medicine

## 2018-06-30 ENCOUNTER — Telehealth: Payer: Self-pay | Admitting: Family Medicine

## 2018-06-30 NOTE — Telephone Encounter (Signed)
Left message to return call 

## 2018-06-30 NOTE — Telephone Encounter (Signed)
Okay to refill the labetalol, however please call patient and have her schedule a follow up OV within the next month for her BP. Thanks!

## 2018-06-30 NOTE — Telephone Encounter (Signed)
Patient is aware and states she will call back to set up.

## 2018-07-07 ENCOUNTER — Telehealth: Payer: Self-pay

## 2018-07-07 NOTE — Telephone Encounter (Signed)
Left message to return call with pt to see if she requested a med refill

## 2018-07-07 NOTE — Telephone Encounter (Signed)
Sorry whats the question

## 2018-07-10 ENCOUNTER — Ambulatory Visit: Payer: BC Managed Care – PPO | Admitting: Family Medicine

## 2018-07-10 ENCOUNTER — Encounter: Payer: Self-pay | Admitting: Family Medicine

## 2018-07-10 VITALS — BP 128/94 | Temp 98.0°F | Ht 63.0 in | Wt 216.0 lb

## 2018-07-10 DIAGNOSIS — B9789 Other viral agents as the cause of diseases classified elsewhere: Secondary | ICD-10-CM

## 2018-07-10 DIAGNOSIS — J069 Acute upper respiratory infection, unspecified: Secondary | ICD-10-CM | POA: Diagnosis not present

## 2018-07-10 NOTE — Patient Instructions (Signed)
Viral Respiratory Infection  A viral respiratory infection is an illness that affects parts of the body that are used for breathing. These include the lungs, nose, and throat. It is caused by a germ called a virus.  Some examples of this kind of infection are:  · A cold.  · The flu (influenza).  · A respiratory syncytial virus (RSV) infection.  A person who gets this illness may have the following symptoms:  · A stuffy or runny nose.  · Yellow or green fluid in the nose.  · A cough.  · Sneezing.  · Tiredness (fatigue).  · Achy muscles.  · A sore throat.  · Sweating or chills.  · A fever.  · A headache.  Follow these instructions at home:  Managing pain and congestion  · Take over-the-counter and prescription medicines only as told by your doctor.  · If you have a sore throat, gargle with salt water. Do this 3-4 times per day or as needed. To make a salt-water mixture, dissolve ½-1 tsp of salt in 1 cup of warm water. Make sure that all the salt dissolves.  · Use nose drops made from salt water. This helps with stuffiness (congestion). It also helps soften the skin around your nose.  · Drink enough fluid to keep your pee (urine) pale yellow.  General instructions    · Rest as much as possible.  · Do not drink alcohol.  · Do not use any products that have nicotine or tobacco, such as cigarettes and e-cigarettes. If you need help quitting, ask your doctor.  · Keep all follow-up visits as told by your doctor. This is important.  How is this prevented?    · Get a flu shot every year. Ask your doctor when you should get your flu shot.  · Do not let other people get your germs. If you are sick:  ? Stay home from work or school.  ? Wash your hands with soap and water often. Wash your hands after you cough or sneeze. If soap and water are not available, use hand sanitizer.  · Avoid contact with people who are sick during cold and flu season. This is in fall and winter.  Get help if:  · Your symptoms last for 10 days or  longer.  · Your symptoms get worse over time.  · You have a fever.  · You have very bad pain in your face or forehead.  · Parts of your jaw or neck become very swollen.  Get help right away if:  · You feel pain or pressure in your chest.  · You have shortness of breath.  · You faint or feel like you will faint.  · You keep throwing up (vomiting).  · You feel confused.  Summary  · A viral respiratory infection is an illness that affects parts of the body that are used for breathing.  · Examples of this illness include a cold, the flu, and respiratory syncytial virus (RSV) infection.  · The infection can cause a runny nose, cough, sneezing, sore throat, and fever.  · Follow what your doctor tells you about taking medicines, drinking lots of fluid, washing your hands, resting at home, and avoiding people who are sick.  This information is not intended to replace advice given to you by your health care provider. Make sure you discuss any questions you have with your health care provider.  Document Released: 04/29/2008 Document Revised: 06/27/2017 Document Reviewed: 06/27/2017  Elsevier   Interactive Patient Education © 2019 [employer]

## 2018-07-10 NOTE — Progress Notes (Signed)
   Subjective:    Patient ID: Zoe Evans, female    DOB: Jun 15, 1985, 33 y.o.   MRN: [id number]  HPI  Patient is here today with complaints of a cough occasionally productive,wheezing,runny nose,bilateral ear pain,headache,sore throat, body aches, chest and head congestion for the last three days . She takes Dayquil,Tylenol.  No fever.   States son was sick with viral URI last week  Review of Systems  Constitutional: Negative for fever.  HENT: Positive for congestion, ear pain and sore throat. Negative for sinus pressure and sinus pain.   Eyes: Negative for discharge.  Respiratory: Positive for cough. Negative for shortness of breath.   Gastrointestinal: Negative for diarrhea, nausea and vomiting.  Musculoskeletal: Positive for myalgias.  Neurological: Positive for headaches.       Objective:   Physical Exam Vitals signs and nursing note reviewed.  Constitutional:      General: She is not in acute distress.    Appearance: Normal appearance. She is not toxic-appearing.  HENT:     Head: Normocephalic and atraumatic.     Right Ear: Tympanic membrane normal.     Left Ear: Tympanic membrane normal.     Nose: Congestion present.     Right Sinus: No maxillary sinus tenderness or frontal sinus tenderness.     Left Sinus: No maxillary sinus tenderness or frontal sinus tenderness.     Mouth/Throat:     Mouth: Mucous membranes are moist.     Pharynx: Oropharynx is clear.  Eyes:     General:        Right eye: No discharge.        Left eye: No discharge.  Neck:     Musculoskeletal: Neck supple. No neck rigidity.  Cardiovascular:     Rate and Rhythm: Normal rate and regular rhythm.     Heart sounds: Normal heart sounds.  Pulmonary:     Effort: Pulmonary effort is normal. No respiratory distress.     Breath sounds: Normal breath sounds. No wheezing or rales.  Lymphadenopathy:     Cervical: No cervical adenopathy.  Skin:    General: Skin is warm and dry.  Neurological:   Mental Status: She is alert and oriented to person, place, and time.           Assessment & Plan:  Viral URI with cough  Discussed likely viral etiology at this time, potentially flu however past the window for tamiflu. Symptomatic care discussed. Warning signs discussed. F/u if symptoms worsen or fail to improve over the next several days.

## 2018-07-11 NOTE — Telephone Encounter (Signed)
Pt came in office with her baby and states she does not need any refills at this time.

## 2018-07-25 ENCOUNTER — Ambulatory Visit (INDEPENDENT_AMBULATORY_CARE_PROVIDER_SITE_OTHER): Payer: BC Managed Care – PPO | Admitting: Family Medicine

## 2018-07-25 VITALS — BP 126/84 | Ht 63.0 in | Wt 214.4 lb

## 2018-07-25 DIAGNOSIS — M5441 Lumbago with sciatica, right side: Secondary | ICD-10-CM | POA: Diagnosis not present

## 2018-07-25 DIAGNOSIS — I10 Essential (primary) hypertension: Secondary | ICD-10-CM | POA: Diagnosis not present

## 2018-07-25 MED ORDER — LOSARTAN POTASSIUM 50 MG PO TABS: 50.0000 mg | ORAL_TABLET | Freq: Every day | ORAL | 5 refills | Status: DC

## 2018-07-25 MED ORDER — MELOXICAM 15 MG PO TABS: 15.0000 mg | ORAL_TABLET | Freq: Every day | ORAL | 3 refills | Status: DC

## 2018-07-25 NOTE — Progress Notes (Signed)
   Subjective:    Patient ID: Zoe Evans, female    DOB: 04/12/1986, 33 y.o.   MRN: [id number]  HPI Patient states she is having ongoing pain in her back that goes into her legs. Patient states it still hurts to flex her feet. Patient states she is also having fluctuations in her blood pressure where sometimes it is high and sometimes it is low.   bp up and down a lot  Very nauseated thirty min after taking the abetslol   Started labetalol erarly in the course of presgance   Not breast feeding     uppe arm cuff electric zt thome    Numbers highis hp per mom   Not walking these days  planninng to walk more in the sprin  Prednisone taper did not help   Right leg is more painful than the left   From the foot all the way into the hip    Review of Systems No headache, no major weight loss or weight gain, no chest pain no back pain abdominal pain no change in bowel habits complete ROS otherwise negative     Objective:   Physical Exam Alert and oriented, vitals reviewed and stable, NAD ENT-TM's and ext canals WNL bilat via otoscopic exam Soft palate, tonsils and post pharynx WNL via oropharyngeal exam Neck-symmetric, no masses; thyroid nonpalpable and nontender Pulmonary-no tachypnea or accessory muscle use; Clear without wheezes via auscultation Card--no abnrml murmurs, rhythm reg and rate WNL Carotid pulses symmetric, without bruits Right leg positive straight leg raise evident.  Positive right sciatic notch tenderness.  Reflexes intact.  Sensation intact.       Assessment & Plan:  Impression 1 sciatica right leg.  Persisting.  Discussed.  Will prescribe anti-inflammatory.  2.  Hypertension.  Suboptimal control.  Labile numbers up and down.  Now not breast-feeding.  We will go ahead and switch back to losartan at prior dose rationale discussed  Follow-up as scheduled local measures discussed  Greater than 50% of this 25 minute face to face visit was spent  in counseling and discussion and coordination of care regarding the above diagnosis/diagnosies

## 2018-08-08 ENCOUNTER — Encounter (HOSPITAL_COMMUNITY): Payer: Self-pay

## 2018-08-08 ENCOUNTER — Ambulatory Visit (HOSPITAL_COMMUNITY): Payer: Self-pay

## 2018-10-02 ENCOUNTER — Encounter: Payer: Self-pay | Admitting: Family Medicine

## 2018-10-02 NOTE — Telephone Encounter (Signed)
Pt called and scheduled an appt for today

## 2018-10-09 ENCOUNTER — Encounter: Payer: Self-pay | Admitting: Family Medicine

## 2018-10-09 ENCOUNTER — Telehealth: Payer: Self-pay | Admitting: Family Medicine

## 2018-10-09 MED ORDER — CITALOPRAM HYDROBROMIDE 20 MG PO TABS: ORAL_TABLET | ORAL | 2 refills | Status: DC

## 2018-10-09 NOTE — Addendum Note (Signed)
Addended by: Margaretha Sheffield on: 10/09/2018 02:32 PM   Modules accepted: Orders

## 2018-10-09 NOTE — Telephone Encounter (Signed)
Pharmacy requesting refill on Citalopram 20 mg tablet. Take one and one half tablets by mouth once daily. Last seen 07/25/2018 for acute right sided low back pain

## 2018-10-09 NOTE — Telephone Encounter (Signed)
I think may have aready respondd three mo worth

## 2018-10-10 ENCOUNTER — Encounter: Payer: Self-pay | Admitting: Family Medicine

## 2018-10-10 ENCOUNTER — Ambulatory Visit (INDEPENDENT_AMBULATORY_CARE_PROVIDER_SITE_OTHER): Payer: Medicaid Other | Admitting: Family Medicine

## 2018-10-10 ENCOUNTER — Other Ambulatory Visit: Payer: Self-pay

## 2018-10-10 DIAGNOSIS — J310 Chronic rhinitis: Secondary | ICD-10-CM

## 2018-10-10 DIAGNOSIS — J329 Chronic sinusitis, unspecified: Secondary | ICD-10-CM

## 2018-10-10 MED ORDER — CEFDINIR 300 MG PO CAPS: ORAL_CAPSULE | ORAL | 0 refills | Status: DC

## 2018-10-10 MED ORDER — FLUCONAZOLE 150 MG PO TABS: ORAL_TABLET | ORAL | 0 refills | Status: DC

## 2018-10-10 NOTE — Telephone Encounter (Signed)
Prescription was sent electronically to pharmacy 10/09/18

## 2018-10-10 NOTE — Progress Notes (Signed)
   Subjective:    Patient ID: Zoe Evans, female    DOB: [birth year], 33 y.o.   MRN: [id number] Audio plus visual Sinusitis  This is a new problem. Episode onset: 2 weeks. Associated symptoms include headaches, sinus pressure and sneezing. (Blowing blood out of nose when blowing nose, tenderness under eyes) Treatments tried: Claritin, Advil cold and sinus. The treatment provided mild relief.  Pt works at [employer] office and has temp taken each morning.  Virtual Visit via Video Note  I connected with Zoe Evans on 10/10/18 at  2:00 PM EDT by a video enabled telemedicine application and verified that I am speaking with the correct person using two identifiers.  Location: Patient: home Provider: office   I discussed the limitations of evaluation and management by telemedicine and the availability of in person appointments. The patient expressed understanding and agreed to proceed.  History of Present Illness:    Observations/Objective:   Assessment and Plan:   Follow Up Instructions:    I discussed the assessment and treatment plan with the patient. The patient was provided an opportunity to ask questions and all were answered. The patient agreed with the plan and demonstrated an understanding of the instructions.   The patient was advised to call back or seek an in-person evaluation if the symptoms worsen or if the condition fails to improve as anticipated.  I provided 15 minutes of non-face-to-face time during this encounter.   Marlowe [place], LPN  Progressive symptoms.  Now frontal.  Achy at times.  Sharp at other times.  Some radiation ears.  Worse with change in position.  Worsening despite over-the-counter interventions  Review of Systems  HENT: Positive for sinus pressure and sneezing.   Neurological: Positive for headaches.       Objective:   Physical Exam   Virtual visit    Assessment & Plan:  Impression post allergy secondary rhinosinusitis.   Discussed.  Antibiotics prescribed.  Symptom care discussed warning signs discussed

## 2018-11-24 IMAGING — US US OB COMP LESS 14 WK
1 series · 15 of 28 positions shown · non-contrast
Comparison: None.

CLINICAL DATA: Vaginal bleeding and pelvic cramping. Positive urine
pregnancy test. Gestational age by LMP of 7 weeks 2 days.

EXAM:
OBSTETRIC <14 WK ULTRASOUND
TECHNIQUE: Transabdominal ultrasound was performed for evaluation of the
gestation as well as the maternal uterus and adnexal regions.

[Series 1: us ob comp less 14 wk · 15 of 35 slices shown]
[im 1/35]
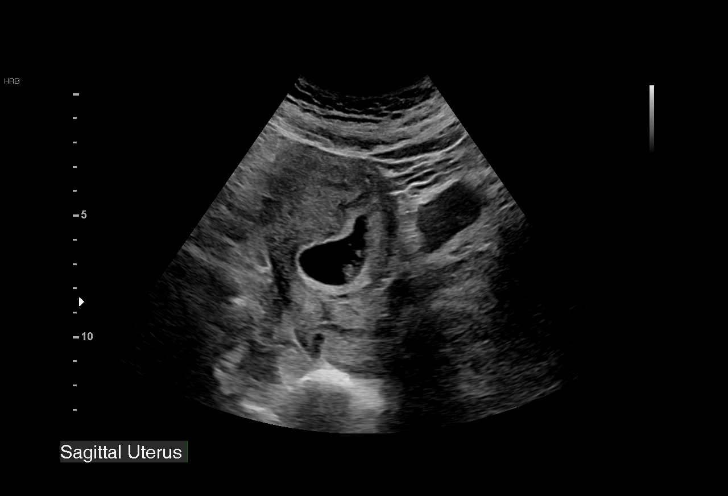
[im 3/35]
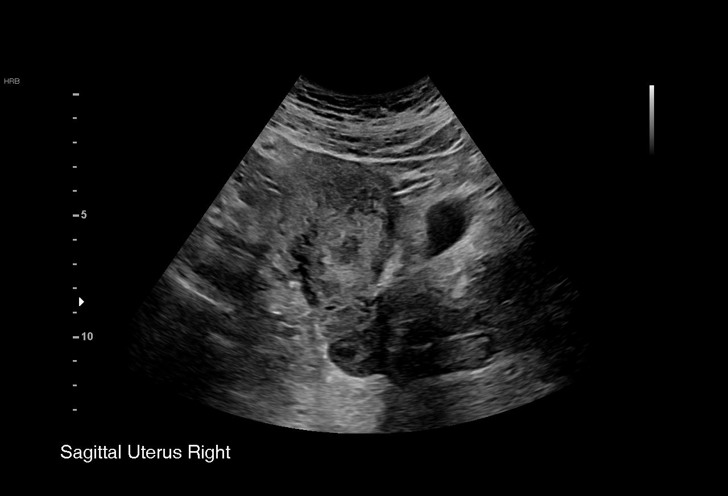
[im 6/35]
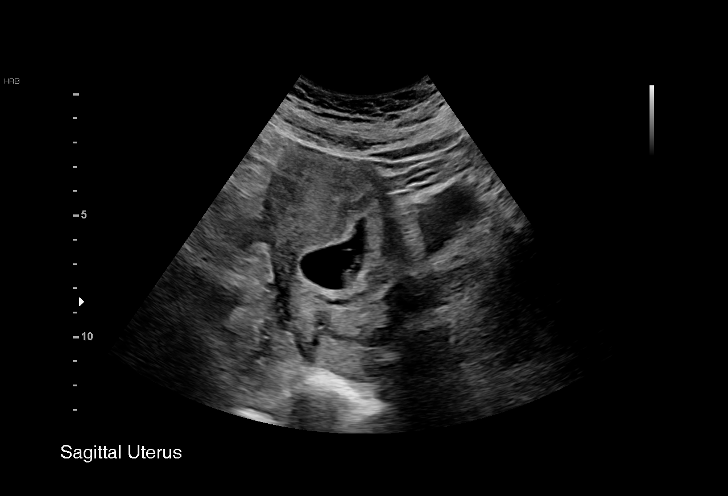
[im 8/35]
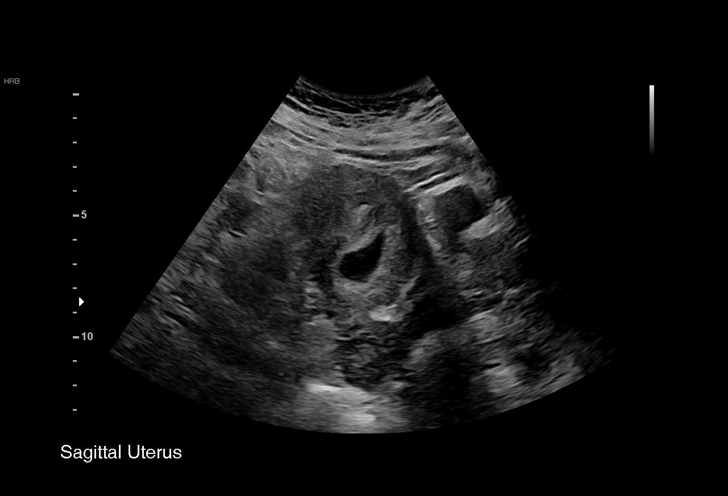
[im 11/35]
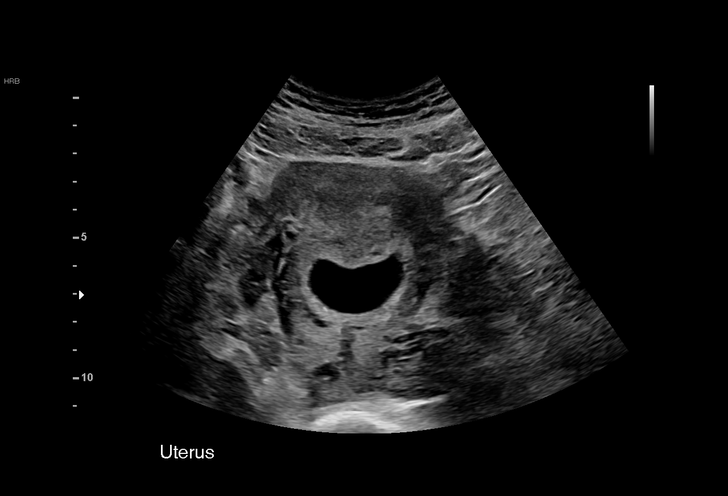
[im 13/35]
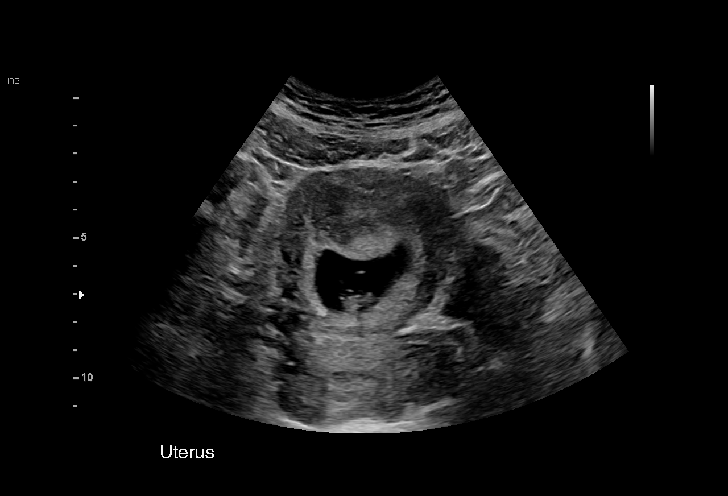
[im 16/35]
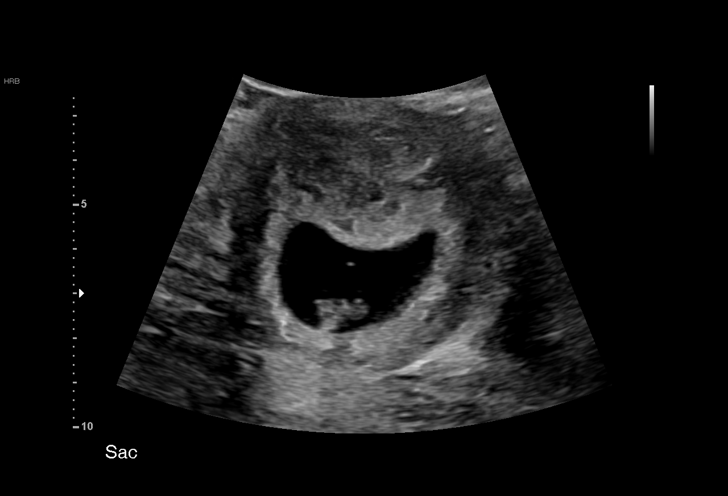
[im 18/35]
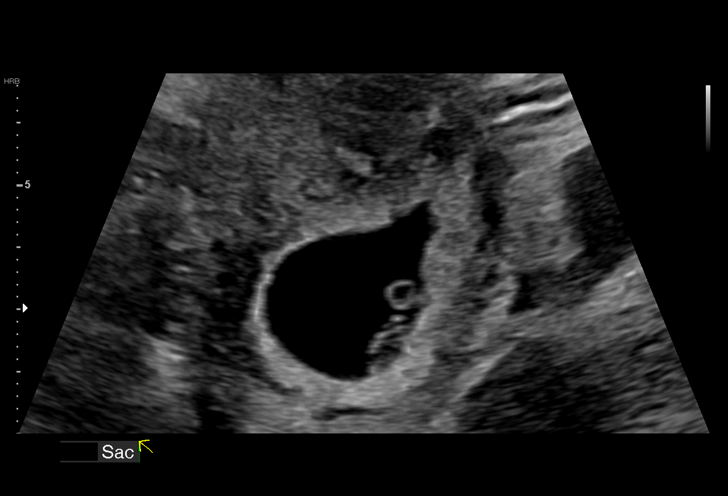
[im 19/35]
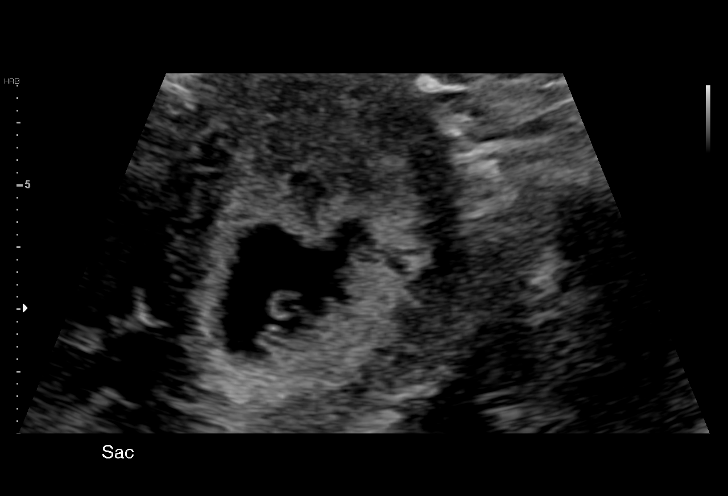
[im 22/35]
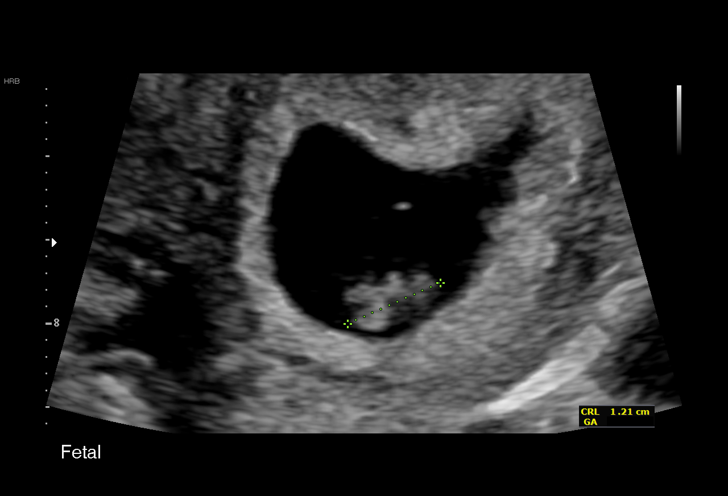
[im 24/35]
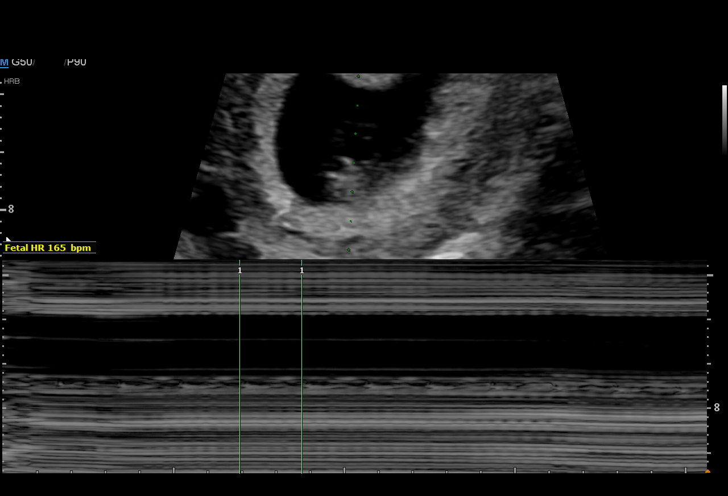
[im 27/35]
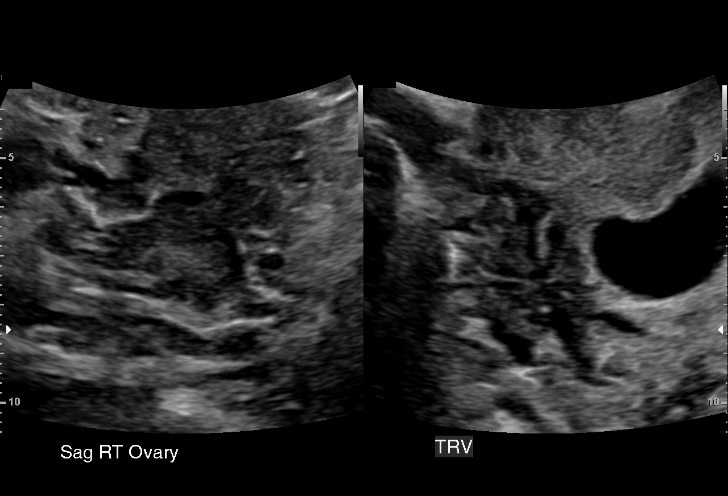
[im 29/35]
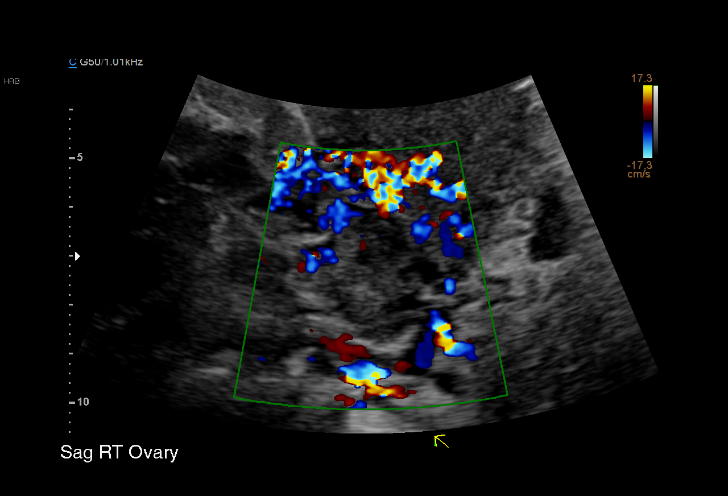
[im 32/35]
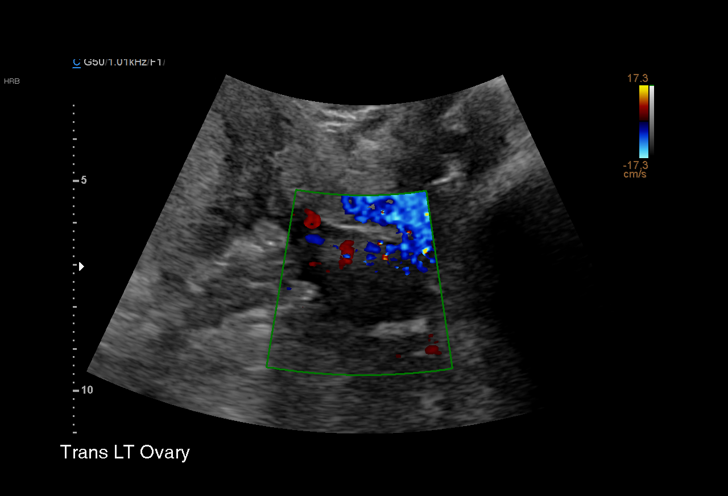
[im 35/35]
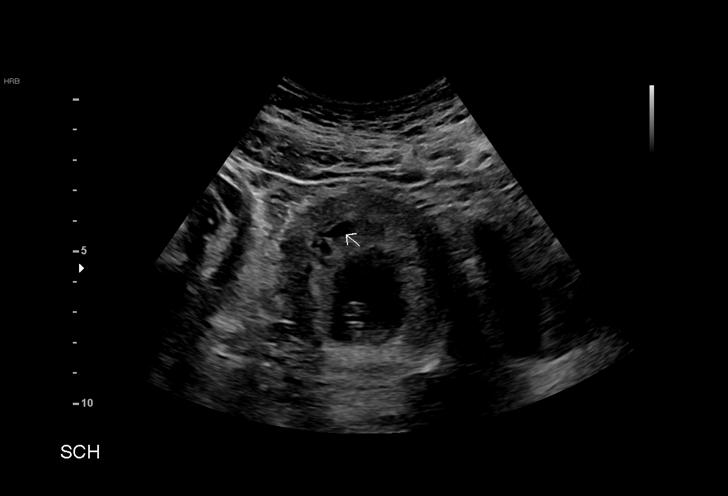

[15 of 28 positions shown; findings below may reference images not displayed]

FINDINGS: Intrauterine gestational sac: Single

Yolk sac:  Visualized.

Embryo:  Visualized.

Cardiac Activity: Visualized.

Heart Rate: 165 bpm

CRL:  13 mm   7 w   3 d                  US EDC: 03/05/2018

Subchorionic hemorrhage:  None visualized.

Maternal uterus/adnexae: Both ovaries are normal in appearance. No
adnexal mass or abnormal free fluid identified.
IMPRESSION: Single living IUP measuring 7 weeks 3 days, with US EDC of
03/05/2018.

No significant maternal uterine or adnexal abnormality identified.

## 2019-01-29 ENCOUNTER — Other Ambulatory Visit: Payer: Self-pay | Admitting: Family Medicine

## 2019-01-30 NOTE — Telephone Encounter (Signed)
Actually tackled  2 26 2020 may ref times one

## 2019-03-03 ENCOUNTER — Other Ambulatory Visit: Payer: Self-pay | Admitting: Family Medicine

## 2019-03-06 ENCOUNTER — Other Ambulatory Visit: Payer: Self-pay | Admitting: Family Medicine

## 2019-03-06 ENCOUNTER — Encounter: Payer: Self-pay | Admitting: Family Medicine

## 2019-03-06 MED ORDER — LOSARTAN POTASSIUM 50 MG PO TABS: 50.0000 mg | ORAL_TABLET | Freq: Every day | ORAL | 0 refills | Status: DC

## 2019-03-06 NOTE — Telephone Encounter (Signed)
Left message

## 2019-03-06 NOTE — Telephone Encounter (Signed)
Please contact pt and schedule appt; then may route to nurse. Thank you

## 2019-03-06 NOTE — Telephone Encounter (Signed)
Has upcoming appt on 03/30/19.  Has been out of meds for a few days.

## 2019-03-07 MED ORDER — CITALOPRAM HYDROBROMIDE 20 MG PO TABS: ORAL_TABLET | ORAL | 0 refills | Status: DC

## 2019-03-07 MED ORDER — MELOXICAM 15 MG PO TABS: 15.0000 mg | ORAL_TABLET | Freq: Every day | ORAL | 0 refills | Status: DC

## 2019-03-30 ENCOUNTER — Ambulatory Visit (INDEPENDENT_AMBULATORY_CARE_PROVIDER_SITE_OTHER): Payer: Medicaid Other | Admitting: Family Medicine

## 2019-03-30 DIAGNOSIS — F418 Other specified anxiety disorders: Secondary | ICD-10-CM

## 2019-03-30 DIAGNOSIS — I10 Essential (primary) hypertension: Secondary | ICD-10-CM

## 2019-03-30 MED ORDER — LOSARTAN POTASSIUM 50 MG PO TABS: 50.0000 mg | ORAL_TABLET | Freq: Every day | ORAL | 1 refills | Status: DC

## 2019-03-30 MED ORDER — VALACYCLOVIR HCL 1 G PO TABS: 1000.0000 mg | ORAL_TABLET | Freq: Every day | ORAL | 5 refills | Status: DC

## 2019-03-30 MED ORDER — CITALOPRAM HYDROBROMIDE 20 MG PO TABS: ORAL_TABLET | ORAL | 1 refills | Status: DC

## 2019-03-30 NOTE — Progress Notes (Signed)
   Subjective:  Audio plus video  Patient ID: Zoe Evans, female    DOB: 1985/10/16, 33 y.o.   MRN: [id number]  Hypertension This is a chronic problem. There are no compliance problems (pt is taking Losartan 50 mg daily).    Pt has issues with pt forgetting where she is while driving. Started about 2 weeks ago; not sure if it is due to having a lot going on.   Virtual Visit via Video Note  I connected with Zoe Evans on 03/30/19 at  9:00 AM EDT by a video enabled telemedicine application and verified that I am speaking with the correct person using two identifiers.  Location: Patient: home Provider: office   I discussed the limitations of evaluation and management by telemedicine and the availability of in person appointments. The patient expressed understanding and agreed to proceed.  History of Present Illness:    Observations/Objective:   Assessment and Plan:   Follow Up Instructions:    I discussed the assessment and treatment plan with the patient. The patient was provided an opportunity to ask questions and all were answered. The patient agreed with the plan and demonstrated an understanding of the instructions.   The patient was advised to call back or seek an in-person evaluation if the symptoms worsen or if the condition fails to improve as anticipated.  I provided 25 minutes of non-face-to-face time during this encounter.   Vicente Males, LPN  Blood pressure medicine and blood pressure levels reviewed today with patient. Compliant with blood pressure medicine. States does not miss a dose. No obvious side effects. Blood pressure generally good when checked elsewhere. Watching salt intake.  Exercise not good  Works at office, [employer], fashion/clothing hectic  Patient notes ongoing compliance with antidepressant medication. No obvious side effects. Reports does not miss a dose. Overall continues to help depression substantially. No thoughts of homicide or  suicide. Would like to maintain medication. Patient admits to considerable stress    Review of Systems No headache, no major weight loss or weight gain, no chest pain no back pain abdominal pain no change in bowel habits complete ROS otherwise negative     Objective:   Physical Exam Virtual       Assessment & Plan:  Impression hypertension.  Apparent good control discussed to maintain same meds comply discussed diet and exercise discussed  2.  Depression some worsening.  Difficulty focusing discussed will increase Celexa rationale discussed  6 months worth of medicine refills given follow-up in  Greater than 50% of this 25 minute none face to face visit was spent in counseling and discussion and coordination of care regarding the above diagnosis/diagnosies

## 2019-04-01 ENCOUNTER — Encounter: Payer: Self-pay | Admitting: Family Medicine

## 2019-04-03 ENCOUNTER — Other Ambulatory Visit: Payer: Self-pay | Admitting: *Deleted

## 2019-04-03 DIAGNOSIS — Z20822 Contact with and (suspected) exposure to covid-19: Secondary | ICD-10-CM

## 2019-04-04 LAB — NOVEL CORONAVIRUS, NAA: SARS-CoV-2, NAA: NOT DETECTED

## 2019-06-28 ENCOUNTER — Telehealth: Payer: Self-pay | Admitting: Family Medicine

## 2019-06-28 NOTE — Telephone Encounter (Signed)
Pt contacted office and states that she has been having some sinus issues for about a month that makes her sinus swell and cause nose bleeds. Pt also states that her blood pressure has been high-running around 150/105. Pt also having some upper chest pain on right side that does go to shoulder; noticed about 2 days ago. Pt has noticed some shortness of breath but patient thinks it is due to weight.   Spoke with Dr.Scott; recommended a virtual visit with Dr.Steve in the morning. If having significant shortness of breath or crushing pain, pt will need to go to ED. Left detailed message on pt voicemail letting her know.

## 2019-06-29 ENCOUNTER — Other Ambulatory Visit: Payer: Self-pay

## 2019-06-29 ENCOUNTER — Encounter: Payer: Self-pay | Admitting: Family Medicine

## 2019-06-29 ENCOUNTER — Ambulatory Visit (INDEPENDENT_AMBULATORY_CARE_PROVIDER_SITE_OTHER): Payer: Medicaid Other | Admitting: Family Medicine

## 2019-06-29 DIAGNOSIS — J310 Chronic rhinitis: Secondary | ICD-10-CM

## 2019-06-29 DIAGNOSIS — I10 Essential (primary) hypertension: Secondary | ICD-10-CM

## 2019-06-29 DIAGNOSIS — J329 Chronic sinusitis, unspecified: Secondary | ICD-10-CM

## 2019-06-29 MED ORDER — LOSARTAN POTASSIUM 100 MG PO TABS: 100.0000 mg | ORAL_TABLET | Freq: Every day | ORAL | 1 refills | Status: DC

## 2019-06-29 MED ORDER — AMOXICILLIN-POT CLAVULANATE 875-125 MG PO TABS: 1.0000 | ORAL_TABLET | Freq: Two times a day (BID) | ORAL | 0 refills | Status: DC

## 2019-06-29 NOTE — Progress Notes (Signed)
   Subjective:  Audio video  Patient ID: Zoe Evans, female    DOB: 1985-06-10, 34 y.o.   MRN: [id number]  Sinusitis This is a new problem. Episode onset: one month. There has been no fever. Associated symptoms include sinus pressure. (Swelling under eyes) Treatments tried: otc sinus meds. The treatment provided mild relief.   Virtual Visit via Telephone Note  I connected with Zoe Evans on 06/29/19 at 10:00 AM EST by telephone and verified that I am speaking with the correct person using two identifiers.  Location: Patient: home Provider: office   I discussed the limitations, risks, security and privacy concerns of performing an evaluation and management service by telephone and the availability of in person appointments. I also discussed with the patient that there may be a patient responsible charge related to this service. The patient expressed understanding and agreed to proceed.   History of Present Illness:    Observations/Objective:   Assessment and Plan:   Follow Up Instructions:    I discussed the assessment and treatment plan with the patient. The patient was provided an opportunity to ask questions and all were answered. The patient agreed with the plan and demonstrated an understanding of the instructions.   The patient was advised to call back or seek an in-person evaluation if the symptoms worsen or if the condition fails to improve as anticipated.  I provided 20 minutes of non-face-to-face time during this encounter.  sinis stuff twice per yr   Causes swelling and h a and pressure   Pos nasal disch blood  Has come on over a month  No one else sick   Got rx  Review of Systems  HENT: Positive for sinus pressure.        Objective:   Physical Exam   Virtual     Assessment & Plan:  Impression subacute rhinosinusitis.  Symptoms started several weeks ago.  Persistent at this time.  Antibiotics prescribed.  Advised patient if she  contacted [place] only a few days into symptoms we may well have done COVID-19 testing.  Not indicated at this time.  2.  Hypertension suboptimal control discussed increase losartan 100 daily

## 2019-07-02 ENCOUNTER — Encounter: Payer: Self-pay | Admitting: Family Medicine

## 2019-09-17 ENCOUNTER — Encounter (HOSPITAL_COMMUNITY): Payer: Self-pay | Admitting: *Deleted

## 2019-09-17 ENCOUNTER — Ambulatory Visit
Admission: EM | Admit: 2019-09-17 | Discharge: 2019-09-17 | Disposition: A | Discharge status: Home / Self Care | Payer: Self-pay

## 2019-09-17 ENCOUNTER — Other Ambulatory Visit: Payer: Self-pay

## 2019-09-17 ENCOUNTER — Emergency Department (HOSPITAL_COMMUNITY): Payer: Self-pay

## 2019-09-17 ENCOUNTER — Emergency Department (HOSPITAL_COMMUNITY)
Admission: EM | Admit: 2019-09-17 | Discharge: 2019-09-17 | Disposition: A | Discharge status: Home / Self Care | Payer: Self-pay | Attending: Emergency Medicine | Admitting: Emergency Medicine

## 2019-09-17 DIAGNOSIS — Z79899 Other long term (current) drug therapy: Secondary | ICD-10-CM | POA: Insufficient documentation

## 2019-09-17 DIAGNOSIS — I10 Essential (primary) hypertension: Secondary | ICD-10-CM | POA: Insufficient documentation

## 2019-09-17 DIAGNOSIS — K529 Noninfective gastroenteritis and colitis, unspecified: Secondary | ICD-10-CM | POA: Insufficient documentation

## 2019-09-17 LAB — COMPREHENSIVE METABOLIC PANEL
ALT: 17 U/L (ref 0–44)
AST: 19 U/L (ref 15–41)
Albumin: 3.9 g/dL (ref 3.5–5.0)
Alkaline Phosphatase: 49 U/L (ref 38–126)
Anion gap: 7 (ref 5–15)
BUN: 8 mg/dL (ref 6–20)
CO2: 27 mmol/L (ref 22–32)
Calcium: 8.8 mg/dL — ABNORMAL LOW (ref 8.9–10.3)
Chloride: 103 mmol/L (ref 98–111)
Creatinine, Ser: 0.68 mg/dL (ref 0.44–1.00)
GFR calc Af Amer: >60 mL/min (ref >60)
GFR calc non Af Amer: >60 mL/min (ref >60)
Glucose, Bld: 98 mg/dL (ref 70–99)
Potassium: 3.6 mmol/L (ref 3.5–5.1)
Sodium: 137 mmol/L (ref 135–145)
Total Bilirubin: 0.5 mg/dL (ref 0.3–1.2)
Total Protein: 6.9 g/dL (ref 6.5–8.1)

## 2019-09-17 LAB — URINALYSIS, ROUTINE W REFLEX MICROSCOPIC
Bilirubin Urine: NEGATIVE
Glucose, UA: NEGATIVE mg/dL
Ketones, ur: NEGATIVE mg/dL
Leukocytes,Ua: NEGATIVE
Nitrite: NEGATIVE
Protein, ur: NEGATIVE mg/dL
Specific Gravity, Urine: 1.01 (ref 1.005–1.030)
pH: 6 (ref 5.0–8.0)

## 2019-09-17 LAB — CBC
HCT: 39.5 % (ref 36.0–46.0)
Hemoglobin: 12.7 g/dL (ref 12.0–15.0)
MCH: 27.5 pg (ref 26.0–34.0)
MCHC: 32.2 g/dL (ref 30.0–36.0)
MCV: 85.7 fL (ref 80.0–100.0)
Platelets: 319 10*3/uL (ref 150–400)
RBC: 4.61 MIL/uL (ref 3.87–5.11)
RDW: 12.5 % (ref 11.5–15.5)
WBC: 8.6 10*3/uL (ref 4.0–10.5)
nRBC: 0 % (ref 0.0–0.2)

## 2019-09-17 LAB — PREGNANCY, URINE: Preg Test, Ur: NEGATIVE

## 2019-09-17 LAB — LIPASE, BLOOD: Lipase: 36 U/L (ref 11–51)

## 2019-09-17 MED ORDER — HYDROCODONE-ACETAMINOPHEN 5-325 MG PO TABS: ORAL_TABLET | ORAL | 0 refills | Status: DC

## 2019-09-17 MED ORDER — IOHEXOL 300 MG/ML  SOLN
100.0000 mL | Freq: Once | INTRAMUSCULAR | Status: AC | PRN
  Administered 2019-09-17: 100 mL via INTRAVENOUS

## 2019-09-17 MED ORDER — HYDROMORPHONE HCL 1 MG/ML IJ SOLN
0.5000 mg | Freq: Once | INTRAMUSCULAR | Status: AC
  Administered 2019-09-17: 0.5 mg via INTRAVENOUS
  Filled 2019-09-17: qty 1

## 2019-09-17 MED ORDER — CIPROFLOXACIN HCL 500 MG PO TABS: 500.0000 mg | ORAL_TABLET | Freq: Two times a day (BID) | ORAL | 0 refills | Status: DC

## 2019-09-17 MED ORDER — METRONIDAZOLE 500 MG PO TABS: 500.0000 mg | ORAL_TABLET | Freq: Two times a day (BID) | ORAL | 0 refills | Status: DC

## 2019-09-17 MED ORDER — ONDANSETRON HCL 4 MG/2ML IJ SOLN
4.0000 mg | Freq: Once | INTRAMUSCULAR | Status: AC
  Administered 2019-09-17: 4 mg via INTRAVENOUS
  Filled 2019-09-17: qty 2

## 2019-09-17 NOTE — ED Provider Notes (Signed)
[facility] EMERGENCY DEPARTMENT Provider Note   CSN: [id number] Arrival date & time: 09/17/19  1027     History Chief Complaint  Patient presents with  . Abdominal Pain    Zoe Evans is a 34 y.o. female.  HPI      Zoe Evans is a 34 y.o. female with a medical history significant for back pain, hypertension, and anxiety who presents to the Emergency Department complaining of right lower quadrant pain.  Symptoms began over the weekend and associated with multiple bouts of diarrhea.  She was seen earlier today at the local urgent care and sent to the ER for further evaluation.  She describes the pain as constant with waxing and waning sharp pains and radiates into her right flank and back.  Pain has also been associated with nausea and a few episodes of vomiting.  Pain is not associated with food intake.  She denies fever or chills, dysuria, recent abdominal surgeries, melena or hematochezia. No recent antibiotics    Past Medical History:  Diagnosis Date  . Anxiety   . Back pain   . Depression   . Hearing loss in left ear   . HPV (human papilloma virus) anogenital infection   . Hypertension   . Vaginal Pap smear, abnormal     Patient Active Problem List   Diagnosis Date Noted  . Pregnancy 02/28/2018  . Essential hypertension 04/05/2016  . Migraine headache 08/09/2014  . Depression with anxiety 07/01/2013    Past Surgical History:  Procedure Laterality Date  . CESAREAN SECTION    . CESAREAN SECTION    . CESAREAN SECTION N/A 02/28/2018   Procedure: CESAREAN SECTION;  Surgeon: Olga Millers, MD;  Location: [facility];  Service: Obstetrics;  Laterality: N/A;  Heather RNFA - MD declined RNFA, Philis Pique to assist     OB History    Gravida  3   Para  3   Term  2   Preterm  0   AB  0   Living  2     SAB  0   TAB  0   Ectopic  0   Multiple  0   Live Births  2           Family History  Problem Relation Age of Onset  .  Hypertension Mother   . Cancer Maternal Aunt   . Cancer Maternal Grandmother   . Cancer Paternal Grandmother   . Breast cancer Maternal Aunt 30       3 maternal cousins as well  . Breast cancer Maternal Grandmother 13  . Breast cancer Cousin   . Breast cancer Cousin   . Breast cancer Cousin     Social History   Tobacco Use  . Smoking status: Never Smoker  . Smokeless tobacco: Never Used  Substance Use Topics  . Alcohol use: Not Currently    Comment: occasional  . Drug use: Never    Home Medications Prior to Admission medications   Medication Sig Start Date End Date Taking? Authorizing Provider  amoxicillin-clavulanate (AUGMENTIN) 875-125 MG tablet Take 1 tablet by mouth 2 (two) times daily. 06/29/19   Mikey Kirschner, MD  citalopram (CELEXA) 20 MG tablet TAKE 2 TABLETS BY MOUTH EACH MORNING 03/30/19   Mikey Kirschner, MD  losartan (COZAAR) 100 MG tablet Take 1 tablet (100 mg total) by mouth daily. 06/29/19   Mikey Kirschner, MD  meloxicam (MOBIC) 15 MG tablet Take 1 tablet (15  mg total) by mouth daily. 03/07/19   Merlyn Albert, MD  valACYclovir (VALTREX) 1000 MG tablet Take 1 tablet (1,000 mg total) by mouth daily. 03/30/19   Merlyn Albert, MD    Allergies    Ace inhibitors and Codeine  Review of Systems   Review of Systems  Constitutional: Negative for appetite change, chills and fever.  Respiratory: Negative for shortness of breath.   Cardiovascular: Negative for chest pain.  Gastrointestinal: Positive for abdominal pain, nausea and vomiting. Negative for blood in stool and diarrhea.  Genitourinary: Negative for decreased urine volume, difficulty urinating, dysuria and flank pain.  Musculoskeletal: Positive for back pain (right back pain).  Skin: Negative for color change and rash.  Neurological: Negative for dizziness, weakness and numbness.  Hematological: Negative for adenopathy.  All other systems reviewed and are negative.   Physical Exam Updated  Vital Signs BP (!) 127/93   Pulse 74   Temp 98.1 F (36.7 C) (Oral)   Resp 18   LMP 09/10/2019   SpO2 100%   Physical Exam Vitals and nursing note reviewed.  Constitutional:      General: She is not in acute distress.    Appearance: She is well-developed. She is not ill-appearing.  HENT:     Head: Normocephalic.  Cardiovascular:     Rate and Rhythm: Normal rate and regular rhythm.     Heart sounds: Normal heart sounds. No murmur.  Pulmonary:     Effort: Pulmonary effort is normal. No respiratory distress.     Breath sounds: Normal breath sounds.  Abdominal:     General: Bowel sounds are normal. There is no distension.     Palpations: Abdomen is soft. There is no mass.     Tenderness: There is abdominal tenderness in the right lower quadrant. There is no right CVA tenderness, left CVA tenderness, guarding or rebound. Negative signs include McBurney's sign.  Musculoskeletal:        General: Normal range of motion.  Skin:    General: Skin is warm.     Capillary Refill: Capillary refill takes less than 2 seconds.     Findings: No rash.  Neurological:     General: No focal deficit present.     Mental Status: She is alert.     Motor: No weakness or abnormal muscle tone.     Coordination: Coordination normal.     ED Results / Procedures / Treatments   Labs (all labs ordered are listed, but only abnormal results are displayed) Labs Reviewed  COMPREHENSIVE METABOLIC PANEL - Abnormal; Notable for the following components:      Result Value   Calcium 8.8 (*)    All other components within normal limits  URINALYSIS, ROUTINE W REFLEX MICROSCOPIC - Abnormal; Notable for the following components:   APPearance HAZY (*)    Hgb urine dipstick SMALL (*)    Bacteria, UA MANY (*)    All other components within normal limits  LIPASE, BLOOD  CBC  PREGNANCY, URINE    EKG None  Radiology CT ABDOMEN PELVIS W CONTRAST  Result Date: 09/17/2019 CLINICAL DATA:  Right lower quadrant  abdominal pain, nausea/vomiting EXAM: CT ABDOMEN AND PELVIS WITH CONTRAST TECHNIQUE: Multidetector CT imaging of the abdomen and pelvis was performed using the standard protocol following bolus administration of intravenous contrast. CONTRAST:  OMNIPAQUE IOHEXOL 300 MG/ML  SOLN COMPARISON:  None. FINDINGS: Lower chest: Lung bases are clear. Hepatobiliary: Mild scarring inferiorly in the right hepatic lobe. Liver is  otherwise within normal limits. Gallbladder is unremarkable. No intrahepatic or extrahepatic ductal dilatation. Pancreas: Focal fat along the posterior aspect of the uncinate process (series 2/image 38), benign. Spleen: Within normal limits. Adrenals/Urinary Tract: Adrenal glands are within normal limits. Kidneys are within normal limits. No hydronephrosis. Bladder is within normal limits. Stomach/Bowel: Stomach is within normal limits. No evidence of bowel obstruction. Normal appendix (series 2/image 59). Mild colonic wall thickening along the hepatic flexure (series 2/image 45), although underdistended. Vascular/Lymphatic: No evidence of abdominal aortic aneurysm. Celiac artery, SMA, and IMA are patent. No suspicious abdominopelvic lymphadenopathy. Reproductive: Uterus is within normal limits. Bilateral ovaries are within normal limits. Other: Trace pelvic ascites, physiologic. Musculoskeletal: Visualized osseous structures are within normal limits. IMPRESSION: No evidence of bowel obstruction.  Normal appendix. Mild colonic wall thickening along the hepatic flexure, focal, correlate for infectious/inflammatory colitis. Otherwise, no CT findings to account for the patient's right lower quadrant abdominal pain. Electronically Signed   By: Charline Bills M.D.   On: 09/17/2019 12:42    Procedures Procedures (including critical care time)  Medications Ordered in ED Medications  ondansetron (ZOFRAN) injection 4 mg (4 mg Intravenous Given 09/17/19 1249)  HYDROmorphone (DILAUDID) injection 0.5  mg (0.5 mg Intravenous Given 09/17/19 1255)  iohexol (OMNIPAQUE) 300 MG/ML solution 100 mL (100 mLs Intravenous Contrast Given 09/17/19 1226)    ED Course  I have reviewed the triage vital signs and the nursing notes.  Pertinent labs & imaging results that were available during my care of the patient were reviewed by me and considered in my medical decision making (see chart for details).    MDM Rules/Calculators/A&P                      Patient well-appearing and nontoxic.  Here with right lower quadrant pain for several days.  Pain is been associated with diarrhea and intermittent episodes of vomiting.  No fever.  No peritoneal signs.  no leukocytosis.  CT scan shows a possible colitis.  Appendix normal-appearing on CT.  Laboratory studies also reassuring.  Patient is noted to be hypertensive, agrees to take her antihypertensive medication when she returns home.  Discussed findings with patient, will treat with Cipro and Flagyl and refer to GI.  Patient agrees to plan.  Strict return precautions discussed.  Final Clinical Impression(s) / ED Diagnoses Final diagnoses:  Colitis    Rx / DC Orders ED Discharge Orders    None       Rosey Bath 09/18/19 2042    Raeford Razor, MD 09/19/19 [phone]

## 2019-09-17 NOTE — ED Triage Notes (Signed)
Pain in right lower quadrant 

## 2019-09-17 NOTE — ED Triage Notes (Signed)
Pt presents with right side lower quadrant pain and is walking bent over. Based on patients symptoms , pt should go to ED for higher level of care . Pt agreeable

## 2019-09-17 NOTE — Discharge Instructions (Signed)
Your CT scan today shows that you have a mild colitis.  It is important that you take the antibiotics as directed until they are finished.  Contact the GI provider listed to arrange a follow-up appointment.  Return to emergency department if you develop any worsening symptoms such as increasing abdominal pain, fever, or persistent vomiting.

## 2019-09-28 ENCOUNTER — Encounter: Payer: Self-pay | Admitting: Family Medicine

## 2019-09-28 ENCOUNTER — Other Ambulatory Visit: Payer: Self-pay

## 2019-09-28 ENCOUNTER — Ambulatory Visit (INDEPENDENT_AMBULATORY_CARE_PROVIDER_SITE_OTHER): Payer: Self-pay | Admitting: Family Medicine

## 2019-09-28 VITALS — BP 130/82 | HR 89 | Temp 97.9°F | Ht 63.0 in | Wt 201.6 lb

## 2019-09-28 DIAGNOSIS — R1031 Right lower quadrant pain: Secondary | ICD-10-CM

## 2019-09-28 DIAGNOSIS — K529 Noninfective gastroenteritis and colitis, unspecified: Secondary | ICD-10-CM

## 2019-09-28 DIAGNOSIS — K219 Gastro-esophageal reflux disease without esophagitis: Secondary | ICD-10-CM

## 2019-09-28 MED ORDER — ONDANSETRON 4 MG PO TBDP: 4.0000 mg | ORAL_TABLET | Freq: Three times a day (TID) | ORAL | 0 refills | Status: DC | PRN

## 2019-09-28 MED ORDER — FAMOTIDINE 20 MG PO TABS: 20.0000 mg | ORAL_TABLET | Freq: Every day | ORAL | 1 refills | Status: DC

## 2019-09-28 MED ORDER — DICYCLOMINE HCL 10 MG PO CAPS: 10.0000 mg | ORAL_CAPSULE | Freq: Three times a day (TID) | ORAL | 0 refills | Status: DC

## 2019-09-28 NOTE — Patient Instructions (Signed)
Bentyl as needed for pain. zofran for nausea pepcid daily for reflux  Call if not better 1-2 wks.

## 2019-09-28 NOTE — Progress Notes (Signed)
Patient ID: Zoe Evans, female    DOB: 24-Mar-1986, 34 y.o.   MRN: [id number]   Chief Complaint  Patient presents with  . ER follow up    colitis   Subjective:   HPI  HPI Still having RLQ pain.  Pt seen in ER 1 wk ago for diarrhea and rt abd pain. CT negative for appendicitis. Was dx with colitis.  Given flagyl and cipro. Pt finished.  Still having intermittent pain. Taking ibuprofen prn. occ having some vomiting and reflux.  No fever or diarrhea.    Medical History Zoe Evans has a past medical history of Anxiety, Back pain, Depression, Hearing loss in left ear, HPV (human papilloma virus) anogenital infection, Hypertension, and Vaginal Pap smear, abnormal.   Outpatient Encounter Medications as of 09/28/2019  Medication Sig  . ciprofloxacin (CIPRO) 500 MG tablet Take 1 tablet (500 mg total) by mouth 2 (two) times daily.  . citalopram (CELEXA) 20 MG tablet TAKE 2 TABLETS BY MOUTH EACH MORNING  . dicyclomine (BENTYL) 10 MG capsule Take 1 capsule (10 mg total) by mouth 4 (four) times daily -  before meals and at bedtime.  . famotidine (PEPCID) 20 MG tablet Take 1 tablet (20 mg total) by mouth daily. 30 mins prior to eating.  Marland Kitchen HYDROcodone-acetaminophen (NORCO/VICODIN) 5-325 MG tablet Take one tab po q 4 hrs prn pain  . losartan (COZAAR) 100 MG tablet Take 1 tablet (100 mg total) by mouth daily.  . meloxicam (MOBIC) 15 MG tablet Take 1 tablet (15 mg total) by mouth daily.  . metroNIDAZOLE (FLAGYL) 500 MG tablet Take 1 tablet (500 mg total) by mouth 2 (two) times daily.  . ondansetron (ZOFRAN ODT) 4 MG disintegrating tablet Take 1 tablet (4 mg total) by mouth every 8 (eight) hours as needed for nausea or vomiting.  . valACYclovir (VALTREX) 1000 MG tablet Take 1 tablet (1,000 mg total) by mouth daily.  . [DISCONTINUED] amoxicillin-clavulanate (AUGMENTIN) 875-125 MG tablet Take 1 tablet by mouth 2 (two) times daily.   No facility-administered encounter medications on file as of  09/28/2019.     Review of Systems  Constitutional: Negative for chills and fever.  HENT: Negative for congestion, rhinorrhea and sore throat.   Respiratory: Negative for cough, shortness of breath and wheezing.   Cardiovascular: Negative for chest pain and leg swelling.  Gastrointestinal: Positive for abdominal pain (rlq), nausea and vomiting (intermittent). Negative for blood in stool, constipation and diarrhea.  Genitourinary: Negative for difficulty urinating, dysuria, frequency and hematuria.  Musculoskeletal: Negative for arthralgias and back pain.  Skin: Negative for rash.  Neurological: Negative for dizziness, weakness and headaches.     Vitals BP 130/82   Pulse 89   Temp 97.9 F (36.6 C) (Oral)   Ht 5\' 3"  (1.6 m)   Wt 201 lb 9.6 oz (91.4 kg)   LMP 09/10/2019   SpO2 100%   BMI 35.71 kg/m   Objective:   Physical Exam Vitals and nursing note reviewed.  Constitutional:      General: She is not in acute distress.    Appearance: Normal appearance. She is not ill-appearing.  HENT:     Head: Normocephalic and atraumatic.  Eyes:     Extraocular Movements: Extraocular movements intact.     Conjunctiva/sclera: Conjunctivae normal.     Pupils: Pupils are equal, round, and reactive to light.  Cardiovascular:     Rate and Rhythm: Normal rate and regular rhythm.     Pulses: Normal pulses.  Heart sounds: Normal heart sounds.  Pulmonary:     Effort: Pulmonary effort is normal.     Breath sounds: Normal breath sounds. No wheezing, rhonchi or rales.  Abdominal:     General: Abdomen is flat. Bowel sounds are normal. There is no distension.     Palpations: Abdomen is soft. There is no mass.     Tenderness: There is abdominal tenderness (rlq on palpation). There is no guarding or rebound.     Hernia: No hernia is present.  Musculoskeletal:        General: Normal range of motion.     Right lower leg: No edema.     Left lower leg: No edema.  Skin:    General: Skin is warm  and dry.     Findings: No lesion or rash.  Neurological:     General: No focal deficit present.     Mental Status: She is alert and oriented to person, place, and time.  Psychiatric:        Mood and Affect: Mood normal.        Behavior: Behavior normal.      Assessment and Plan   1. Colitis - dicyclomine (BENTYL) 10 MG capsule; Take 1 capsule (10 mg total) by mouth 4 (four) times daily -  before meals and at bedtime.  Dispense: 30 capsule; Refill: 0  2. Right lower quadrant pain - dicyclomine (BENTYL) 10 MG capsule; Take 1 capsule (10 mg total) by mouth 4 (four) times daily -  before meals and at bedtime.  Dispense: 30 capsule; Refill: 0 - ondansetron (ZOFRAN ODT) 4 MG disintegrating tablet; Take 1 tablet (4 mg total) by mouth every 8 (eight) hours as needed for nausea or vomiting.  Dispense: 20 tablet; Refill: 0  3. Gastroesophageal reflux disease, unspecified whether esophagitis present - famotidine (PEPCID) 20 MG tablet; Take 1 tablet (20 mg total) by mouth daily. 30 mins prior to eating.  Dispense: 30 tablet; Refill: 1   Colitis -improving.  Diarrhea resolved.  Some vomiting occ and refluxing.  Finished course of flagyl and cipro. Had hydrocodone, not taking it at this time. Take tylenol or ibuprofen prn.  Not wanting to mask the pian, to call if worsening pain. Reviewed ER notes, labs, imaging.  Pt given bentyl, zofran, and pepcid to take for the pain and reflux.   Advising to call or rto if worsening, pain, vomiting, fever or blood in stool.  If continuing to have pain may need GI referral.  Pt in agreement.  F/u prn.

## 2019-10-14 ENCOUNTER — Other Ambulatory Visit: Payer: Self-pay | Admitting: Family Medicine

## 2019-11-23 ENCOUNTER — Other Ambulatory Visit: Payer: Self-pay

## 2019-11-23 ENCOUNTER — Ambulatory Visit (INDEPENDENT_AMBULATORY_CARE_PROVIDER_SITE_OTHER): Payer: Self-pay | Admitting: Family Medicine

## 2019-11-23 VITALS — BP 120/68 | HR 93 | Temp 97.5°F | Ht 63.0 in | Wt 198.8 lb

## 2019-11-23 DIAGNOSIS — S161XXA Strain of muscle, fascia and tendon at neck level, initial encounter: Secondary | ICD-10-CM

## 2019-11-23 DIAGNOSIS — M62838 Other muscle spasm: Secondary | ICD-10-CM

## 2019-11-23 MED ORDER — PREDNISONE 10 MG PO TABS: ORAL_TABLET | ORAL | 0 refills | Status: DC

## 2019-11-23 MED ORDER — MELOXICAM 15 MG PO TABS: 15.0000 mg | ORAL_TABLET | Freq: Every day | ORAL | 0 refills | Status: DC

## 2019-11-23 NOTE — Progress Notes (Signed)
Patient ID: Zoe Evans, female    DOB: [birth year], 34 y.o.   MRN: [id number]   Chief Complaint  Patient presents with  . Back Pain    mid to upper back pain this week   Subjective:    HPI   Pt having back pain. Pain is mostly upper back.  Going on for 1 wk.  trying heating pad and OTC NSAIDS Pt is mowing grass now, every other week. Mowed it last week when it started. Using a push mower.  No injury or neck or back. No surgeries on neck/back. Pain with turning neck to the right radiates down to upper back. Taking ibuprofen and tylenol, helping some mildly.  Pain with rotation to the right.  Pain with lifting arms over head bilaterally.  occ having some tingling in triceps and hands bilaterally.  Medical History Maryon has a past medical history of Anxiety, Back pain, Depression, Hearing loss in left ear, HPV (human papilloma virus) anogenital infection, Hypertension, and Vaginal Pap smear, abnormal.   Outpatient Encounter Medications as of 11/23/2019  Medication Sig  . citalopram (CELEXA) 20 MG tablet TAKE 2 TABLETS BY MOUTH IN THE MORNING  . dicyclomine (BENTYL) 10 MG capsule Take 1 capsule (10 mg total) by mouth 4 (four) times daily -  before meals and at bedtime. (Patient not taking: Reported on 11/23/2019)  . famotidine (PEPCID) 20 MG tablet Take 1 tablet (20 mg total) by mouth daily. 30 mins prior to eating. (Patient not taking: Reported on 11/23/2019)  . HYDROcodone-acetaminophen (NORCO/VICODIN) 5-325 MG tablet Take one tab po q 4 hrs prn pain (Patient not taking: Reported on 11/23/2019)  . losartan (COZAAR) 100 MG tablet Take 1 tablet (100 mg total) by mouth daily.  . meloxicam (MOBIC) 15 MG tablet Take 1 tablet (15 mg total) by mouth daily.  . metroNIDAZOLE (FLAGYL) 500 MG tablet Take 1 tablet (500 mg total) by mouth 2 (two) times daily. (Patient not taking: Reported on 11/23/2019)  . ondansetron (ZOFRAN ODT) 4 MG disintegrating tablet Take 1 tablet (4 mg total) by  mouth every 8 (eight) hours as needed for nausea or vomiting.  . predniSONE (DELTASONE) 10 MG tablet Take 4 tab p.o. x 3 days, then take 3 tab x 3days, then 2 tab x 3 days, then 1 tab x 3 days.  . valACYclovir (VALTREX) 1000 MG tablet Take 1 tablet (1,000 mg total) by mouth daily.  . [DISCONTINUED] ciprofloxacin (CIPRO) 500 MG tablet Take 1 tablet (500 mg total) by mouth 2 (two) times daily.  . [DISCONTINUED] meloxicam (MOBIC) 15 MG tablet Take 1 tablet (15 mg total) by mouth daily. (Patient not taking: Reported on 11/23/2019)   No facility-administered encounter medications on file as of 11/23/2019.     Review of Systems  Constitutional: Negative for chills and fever.  HENT: Negative for congestion, rhinorrhea and sore throat.   Respiratory: Negative for cough, shortness of breath and wheezing.   Cardiovascular: Negative for chest pain and leg swelling.  Gastrointestinal: Negative for abdominal pain, diarrhea, nausea and vomiting.  Genitourinary: Negative for dysuria and frequency.  Musculoskeletal: Positive for back pain and neck pain. Negative for arthralgias.  Skin: Negative for rash.  Neurological: Negative for dizziness, weakness and headaches.     Vitals BP 120/68   Pulse 93   Temp (!) 97.5 F (36.4 C) (Oral)   Ht 5\' 3"  (1.6 m)   Wt 198 lb 12.8 oz (90.2 kg)   SpO2 99%   BMI 35.22  kg/m   Objective:   Physical Exam Nursing note reviewed.  Constitutional:      General: She is not in acute distress.    Appearance: Normal appearance.  Musculoskeletal:        General: Tenderness present. No deformity or signs of injury. Normal range of motion.     Comments: +pain radiates down the back with rotation of neck right. ttp over trazpezius on right No spinous process tenderness, c, l, or t spine.   Skin:    General: Skin is warm and dry.     Findings: No erythema, lesion or rash.  Neurological:     General: No focal deficit present.     Mental Status: She is alert and  oriented to person, place, and time.     Cranial Nerves: No cranial nerve deficit.      Assessment and Plan   1. Trapezius muscle spasm - predniSONE (DELTASONE) 10 MG tablet; Take 4 tab p.o. x 3 days, then take 3 tab x 3days, then 2 tab x 3 days, then 1 tab x 3 days.  Dispense: 30 tablet; Refill: 0 - meloxicam (MOBIC) 15 MG tablet; Take 1 tablet (15 mg total) by mouth daily.  Dispense: 30 tablet; Refill: 0  2. Cervical strain, acute, initial encounter - predniSONE (DELTASONE) 10 MG tablet; Take 4 tab p.o. x 3 days, then take 3 tab x 3days, then 2 tab x 3 days, then 1 tab x 3 days.  Dispense: 30 tablet; Refill: 0 - meloxicam (MOBIC) 15 MG tablet; Take 1 tablet (15 mg total) by mouth daily.  Dispense: 30 tablet; Refill: 0   Gave handout for exercises.  Heat/ice 3x per day.  Stretches as tolerated.  Call or rto if not improving.  F/u prn.

## 2019-11-23 NOTE — Patient Instructions (Addendum)
Cervical Strain and Sprain Rehab Ask your health care provider which exercises are safe for you. Do exercises exactly as told by your health care provider and adjust them as directed. It is normal to feel mild stretching, pulling, tightness, or discomfort as you do these exercises. Stop right away if you feel sudden pain or your pain gets worse. Do not begin these exercises until told by your health care provider. Stretching and range-of-motion exercises Cervical side bending  1. Using good posture, sit on a stable chair or stand up. 2. Without moving your shoulders, slowly tilt your left / right ear to your shoulder until you feel a stretch in the opposite side neck muscles. You should be looking straight ahead. 3. Hold for __________ seconds. 4. Repeat with the other side of your neck. Repeat __________ times. Complete this exercise __________ times a day. Cervical rotation  1. Using good posture, sit on a stable chair or stand up. 2. Slowly turn your head to the side as if you are looking over your left / right shoulder. ? Keep your eyes level with the ground. ? Stop when you feel a stretch along the side and the back of your neck. 3. Hold for __________ seconds. 4. Repeat this by turning to your other side. Repeat __________ times. Complete this exercise __________ times a day. Thoracic extension and pectoral stretch 1. Roll a towel or a small blanket so it is about 4 inches (10 cm) in diameter. 2. Lie down on your back on a firm surface. 3. Put the towel lengthwise, under your spine in the middle of your back. It should not be under your shoulder blades. The towel should line up with your spine from your middle back to your lower back. 4. Put your hands behind your head and let your elbows fall out to your sides. 5. Hold for __________ seconds. Repeat __________ times. Complete this exercise __________ times a day. Strengthening exercises Isometric upper cervical flexion 1. Lie on  your back with a thin pillow behind your head and a small rolled-up towel under your neck. 2. Gently tuck your chin toward your chest and nod your head down to look toward your feet. Do not lift your head off the pillow. 3. Hold for __________ seconds. 4. Release the tension slowly. Relax your neck muscles completely before you repeat this exercise. Repeat __________ times. Complete this exercise __________ times a day. Isometric cervical extension  1. Stand about 6 inches (15 cm) away from a wall, with your back facing the wall. 2. Place a soft object, about 6-8 inches (15-20 cm) in diameter, between the back of your head and the wall. A soft object could be a small pillow, a ball, or a folded towel. 3. Gently tilt your head back and press into the soft object. Keep your jaw and forehead relaxed. 4. Hold for __________ seconds. 5. Release the tension slowly. Relax your neck muscles completely before you repeat this exercise. Repeat __________ times. Complete this exercise __________ times a day. Posture and body mechanics Body mechanics refers to the movements and positions of your body while you do your daily activities. Posture is part of body mechanics. Good posture and healthy body mechanics can help to relieve stress in your body's tissues and joints. Good posture means that your spine is in its natural S-curve position (your spine is neutral), your shoulders are pulled back slightly, and your head is not tipped forward. The following are general guidelines for applying improved   posture and body mechanics to your everyday activities. Sitting  1. When sitting, keep your spine neutral and keep your feet flat on the floor. Use a footrest, if necessary, and keep your thighs parallel to the floor. Avoid rounding your shoulders, and avoid tilting your head forward. 2. When working at a desk or a computer, keep your desk at a height where your hands are slightly lower than your elbows. Slide your  chair under your desk so you are close enough to maintain good posture. 3. When working at a computer, place your monitor at a height where you are looking straight ahead and you do not have to tilt your head forward or downward to look at the screen. Standing   When standing, keep your spine neutral and keep your feet about hip-width apart. Keep a slight bend in your knees. Your ears, shoulders, and hips should line up.  When you do a task in which you stand in one place for a long time, place one foot up on a stable object that is 2-4 inches (5-10 cm) high, such as a footstool. This helps keep your spine neutral. Resting When lying down and resting, avoid positions that are most painful for you. Try to support your neck in a neutral position. You can use a contour pillow or a small rolled-up towel. Your pillow should support your neck but not push on it. This information is not intended to replace advice given to you by your health care provider. Make sure you discuss any questions you have with your health care provider. Document Revised: 09/06/2018 Document Reviewed: 02/15/2018 Elsevier Patient Education  2020 [employer]  Neck Exercises Ask your health care provider which exercises are safe for you. Do exercises exactly as told by your health care provider and adjust them as directed. It is normal to feel mild stretching, pulling, tightness, or discomfort as you do these exercises. Stop right away if you feel sudden pain or your pain gets worse. Do not begin these exercises until told by your health care provider. Neck exercises can be important for many reasons. They can improve strength and maintain flexibility in your neck, which will help your upper back and prevent neck pain. Stretching exercises Rotation neck stretching  1. Sit in a chair or stand up. 2. Place your feet flat on the floor, shoulder width apart. 3. Slowly turn your head (rotate) to the right until a slight stretch  is felt. Turn it all the way to the right so you can look over your right shoulder. Do not tilt or tip your head. 4. Hold this position for 10-30 seconds. 5. Slowly turn your head (rotate) to the left until a slight stretch is felt. Turn it all the way to the left so you can look over your left shoulder. Do not tilt or tip your head. 6. Hold this position for 10-30 seconds. Repeat __________ times. Complete this exercise __________ times a day. Neck retraction 1. Sit in a sturdy chair or stand up. 2. Look straight ahead. Do not bend your neck. 3. Use your fingers to push your chin backward (retraction). Do not bend your neck for this movement. Continue to face straight ahead. If you are doing the exercise properly, you will feel a slight sensation in your throat and a stretch at the back of your neck. 4. Hold the stretch for 1-2 seconds. Repeat __________ times. Complete this exercise __________ times a day. Strengthening exercises Neck press 1. Lie on your   back on a firm bed or on the floor with a pillow under your head. 2. Use your neck muscles to push your head down on the pillow and straighten your spine. 3. Hold the position as well as you can. Keep your head facing up (in a neutral position) and your chin tucked. 4. Slowly count to 5 while holding this position. Repeat __________ times. Complete this exercise __________ times a day. Isometrics These are exercises in which you strengthen the muscles in your neck while keeping your neck still (isometrics). 1. Sit in a supportive chair and place your hand on your forehead. 2. Keep your head and face facing straight ahead. Do not flex or extend your neck while doing isometrics. 3. Push forward with your head and neck while pushing back with your hand. Hold for 10 seconds. 4. Do the sequence again, this time putting your hand against the back of your head. Use your head and neck to push backward against the hand pressure. 5. Finally, do the  same exercise on either side of your head, pushing sideways against the pressure of your hand. Repeat __________ times. Complete this exercise __________ times a day. Prone head lifts 1. Lie face-down (prone position), resting on your elbows so that your chest and upper back are raised. 2. Start with your head facing downward, near your chest. Position your chin either on or near your chest. 3. Slowly lift your head upward. Lift until you are looking straight ahead. Then continue lifting your head as far back as you can comfortably stretch. 4. Hold your head up for 5 seconds. Then slowly lower it to your starting position. Repeat __________ times. Complete this exercise __________ times a day. Supine head lifts 1. Lie on your back (supine position), bending your knees to point to the ceiling and keeping your feet flat on the floor. 2. Lift your head slowly off the floor, raising your chin toward your chest. 3. Hold for 5 seconds. Repeat __________ times. Complete this exercise __________ times a day. Scapular retraction 1. Stand with your arms at your sides. Look straight ahead. 2. Slowly pull both shoulders (scapulae) backward and downward (retraction) until you feel a stretch between your shoulder blades in your upper back. 3. Hold for 10-30 seconds. 4. Relax and repeat. Repeat __________ times. Complete this exercise __________ times a day. Contact a health care provider if:  Your neck pain or discomfort gets much worse when you do an exercise.  Your neck pain or discomfort does not improve within 2 hours after you exercise. If you have any of these problems, stop exercising right away. Do not do the exercises again unless your health care provider says that you can. Get help right away if:  You develop sudden, severe neck pain. If this happens, stop exercising right away. Do not do the exercises again unless your health care provider says that you can. This information is not  intended to replace advice given to you by your health care provider. Make sure you discuss any questions you have with your health care provider. Document Revised: 03/15/2018 Document Reviewed: 03/15/2018 Elsevier Patient Education  2020 [employer]  

## 2019-11-28 ENCOUNTER — Encounter: Payer: Self-pay | Admitting: Family Medicine

## 2020-01-22 ENCOUNTER — Telehealth: Payer: Self-pay | Admitting: Family Medicine

## 2020-01-22 ENCOUNTER — Telehealth: Payer: Self-pay | Admitting: *Deleted

## 2020-01-22 NOTE — Telephone Encounter (Signed)
Please contact patient to have her set appt. Then may route back to nurses. Thank you

## 2020-01-22 NOTE — Telephone Encounter (Signed)
Needs appt to f/u on depression and htn.  After appt set we can give refill till then. Thx. Dr. Ladona Ridgel

## 2020-01-22 NOTE — Telephone Encounter (Signed)
[facility] [place] requesting refill on Losartan 100 mg and Citalopram 20 mg. Pt last seen 11/23/19 for muscle spasm. Please advise. Thank you

## 2020-01-23 MED ORDER — LOSARTAN POTASSIUM 100 MG PO TABS: 100.0000 mg | ORAL_TABLET | Freq: Every day | ORAL | 0 refills | Status: DC

## 2020-01-23 MED ORDER — CITALOPRAM HYDROBROMIDE 20 MG PO TABS: ORAL_TABLET | ORAL | 0 refills | Status: DC

## 2020-01-23 NOTE — Telephone Encounter (Signed)
Scheduled 10/1 

## 2020-01-23 NOTE — Addendum Note (Signed)
Addended by: Haze Rushing on: 01/23/2020 08:19 AM   Modules accepted: Orders

## 2020-02-29 ENCOUNTER — Ambulatory Visit: Payer: Medicaid Other | Admitting: Family Medicine

## 2020-04-10 ENCOUNTER — Ambulatory Visit: Payer: Medicaid Other | Admitting: Family Medicine

## 2020-04-17 ENCOUNTER — Encounter: Payer: Self-pay | Admitting: Family Medicine

## 2020-04-17 ENCOUNTER — Ambulatory Visit (INDEPENDENT_AMBULATORY_CARE_PROVIDER_SITE_OTHER): Payer: Self-pay | Admitting: Family Medicine

## 2020-04-17 ENCOUNTER — Other Ambulatory Visit: Payer: Self-pay

## 2020-04-17 VITALS — BP 132/90 | HR 87 | Temp 97.8°F | Wt 204.8 lb

## 2020-04-17 DIAGNOSIS — I10 Essential (primary) hypertension: Secondary | ICD-10-CM

## 2020-04-17 DIAGNOSIS — F418 Other specified anxiety disorders: Secondary | ICD-10-CM

## 2020-04-17 DIAGNOSIS — Z23 Encounter for immunization: Secondary | ICD-10-CM

## 2020-04-17 MED ORDER — CITALOPRAM HYDROBROMIDE 20 MG PO TABS: ORAL_TABLET | ORAL | 0 refills | Status: DC

## 2020-04-17 MED ORDER — BUSPIRONE HCL 10 MG PO TABS: 10.0000 mg | ORAL_TABLET | Freq: Three times a day (TID) | ORAL | 0 refills | Status: DC

## 2020-04-17 NOTE — Progress Notes (Signed)
Patient ID: Zoe Evans, female    DOB: 29-Dec-1985, 34 y.o.   MRN: [id number]   Chief Complaint  Patient presents with  . Depression   Subjective:    HPI  Pt seen for f/u.  Having depression/anxiety.   Worsening with anxiety esp near holidays and waking up with nightmares at night.  Husband went to prison in 2/21. He's gone for 4 years.  Has 2 kids.  2 yr old and 87 yr old. Feeling stress of being a lsingle parent and taking care of 2 kids while husband is in prison.  H/o family breast cancer,  GM- ovarian caner.  Abnormal pap and unable to get rechecked, since had BTL. Heavy periods and pain in the lower abd.  Pt told at gyn it was $200 to get pap smear completed.   Medical History [place] has a past medical history of Anxiety, Back pain, Depression, Hearing loss in left ear, HPV (human papilloma virus) anogenital infection, Hypertension, and Vaginal Pap smear, abnormal.   Outpatient Encounter Medications as of 04/17/2020  Medication Sig  . famotidine (PEPCID) 20 MG tablet Take 1 tablet (20 mg total) by mouth daily. 30 mins prior to eating.  . citalopram (CELEXA) 20 MG tablet TAKE 2 TABLETS BY MOUTH IN THE MORNING  . losartan (COZAAR) 100 MG tablet Take 1 tablet (100 mg total) by mouth daily.  . meloxicam (MOBIC) 15 MG tablet Take 1 tablet (15 mg total) by mouth daily.  . ondansetron (ZOFRAN ODT) 4 MG disintegrating tablet Take 1 tablet (4 mg total) by mouth every 8 (eight) hours as needed for nausea or vomiting.  . valACYclovir (VALTREX) 1000 MG tablet Take 1 tablet (1,000 mg total) by mouth daily.  . [DISCONTINUED] busPIRone (BUSPAR) 10 MG tablet Take 1 tablet (10 mg total) by mouth 3 (three) times daily.  . [DISCONTINUED] citalopram (CELEXA) 20 MG tablet TAKE 2 TABLETS BY MOUTH IN THE MORNING  . [DISCONTINUED] dicyclomine (BENTYL) 10 MG capsule Take 1 capsule (10 mg total) by mouth 4 (four) times daily -  before meals and at bedtime. (Patient not taking: Reported  on 11/23/2019)  . [DISCONTINUED] HYDROcodone-acetaminophen (NORCO/VICODIN) 5-325 MG tablet Take one tab po q 4 hrs prn pain (Patient not taking: Reported on 11/23/2019)  . [DISCONTINUED] metroNIDAZOLE (FLAGYL) 500 MG tablet Take 1 tablet (500 mg total) by mouth 2 (two) times daily. (Patient not taking: Reported on 11/23/2019)  . [DISCONTINUED] predniSONE (DELTASONE) 10 MG tablet Take 4 tab p.o. x 3 days, then take 3 tab x 3days, then 2 tab x 3 days, then 1 tab x 3 days.   No facility-administered encounter medications on file as of 04/17/2020.     Review of Systems  Constitutional: Negative for chills and fever.  HENT: Negative for congestion, rhinorrhea and sore throat.   Respiratory: Negative for cough, shortness of breath and wheezing.   Cardiovascular: Negative for chest pain and leg swelling.  Gastrointestinal: Negative for abdominal pain, diarrhea, nausea and vomiting.  Genitourinary: Negative for dysuria and frequency.  Musculoskeletal: Negative for arthralgias and back pain.  Skin: Negative for rash.  Neurological: Negative for dizziness, weakness and headaches.  Psychiatric/Behavioral: Positive for dysphoric mood and sleep disturbance. Negative for self-injury and suicidal ideas. The patient is nervous/anxious.      Vitals BP 132/90   Pulse 87   Temp 97.8 F (36.6 C)   Wt 204 lb 12.8 oz (92.9 kg)   SpO2 99%   BMI 36.28 kg/m   See  Phq9 and GAD 7. Objective:   Physical Exam Vitals and nursing note reviewed.  Constitutional:      Appearance: Normal appearance.  HENT:     Head: Normocephalic and atraumatic.     Nose: Nose normal.     Mouth/Throat:     Mouth: Mucous membranes are moist.     Pharynx: Oropharynx is clear.  Eyes:     Extraocular Movements: Extraocular movements intact.     Conjunctiva/sclera: Conjunctivae normal.     Pupils: Pupils are equal, round, and reactive to light.  Cardiovascular:     Rate and Rhythm: Regular rhythm. Tachycardia present.      Pulses: Normal pulses.     Heart sounds: Normal heart sounds.  Pulmonary:     Effort: Pulmonary effort is normal.     Breath sounds: Normal breath sounds. No wheezing, rhonchi or rales.  Musculoskeletal:        General: Normal range of motion.     Right lower leg: No edema.     Left lower leg: No edema.  Skin:    General: Skin is warm and dry.     Findings: No lesion or rash.  Neurological:     General: No focal deficit present.     Mental Status: She is alert and oriented to person, place, and time.  Psychiatric:        Mood and Affect: Mood normal.        Behavior: Behavior normal.      Assessment and Plan   1. Depression with anxiety - Ambulatory referral to Psychology  2. Need for vaccination - Flu Vaccine QUAD 6+ mos PF IM (Fluarix Quad PF)  3. Essential hypertension    [facility]. Or if pt wants to come here, she can and just advised her that may get bill for pap, but can try to call them and make a payment plan.  recheck HR- 83 bpm on recheck today.  htn- suboptimal.  Cont with meds. -losartan 100mg .  Depression/anixety- referral to counseling to help. Recommending celexa 40mg  and adding buspar.    Addendum- pt had allergy to buspar, with rash.  Discontinued and started with atarax.   F/u 35mo or prn.

## 2020-04-21 ENCOUNTER — Telehealth: Payer: Self-pay

## 2020-04-21 NOTE — Telephone Encounter (Signed)
Patient states had allergic reaction to medication from 11/18 causing her to have rash on her face,neck chest. Please advise

## 2020-04-21 NOTE — Telephone Encounter (Signed)
It is possible this is an allergic reaction I would recommend stopping the medication Benadryl as needed Should gradually correct itself over the next few days Also let patient know that we will send the message to Dr. Ladona Ridgel who will address this when she comes back

## 2020-04-21 NOTE — Telephone Encounter (Signed)
Rash itches and started Saturday- started Busprone on Friday

## 2020-04-21 NOTE — Telephone Encounter (Signed)
Patient advised per Dr Lorin Picket : It is possible this is an allergic reaction Dr Lorin Picket would recommend stopping the medication Benadryl as needed Should gradually correct itself over the next few days Patient verbalized understanding and message forwarded to Dr Ladona Ridgel.

## 2020-04-22 NOTE — Telephone Encounter (Signed)
Pls call pt to see how she is doing from the rash/buspar.   Also, if needed we can send in hydroxyzine to help with anxiety and sleep if she wants.   Let us know.  Thx,   Dr. Ladona Ridgel

## 2020-04-22 NOTE — Telephone Encounter (Signed)
Left message to return call 

## 2020-04-23 ENCOUNTER — Telehealth: Payer: Self-pay | Admitting: Family Medicine

## 2020-04-23 MED ORDER — HYDROXYZINE HCL 25 MG PO TABS: 25.0000 mg | ORAL_TABLET | Freq: Three times a day (TID) | ORAL | 0 refills | Status: DC | PRN

## 2020-04-23 NOTE — Addendum Note (Signed)
Addended by: Annalee Genta on: 04/23/2020 01:44 PM   Modules accepted: Orders

## 2020-04-23 NOTE — Telephone Encounter (Signed)
Pt returned call regarding rash. Pt states rash has went down and is almost totally gone. Pt would like hydroxyzine sent to [facility]. Please advise. Thank you

## 2020-04-23 NOTE — Telephone Encounter (Signed)
Left message to return call 

## 2020-04-23 NOTE — Telephone Encounter (Signed)
Pt is aware.  

## 2020-04-27 ENCOUNTER — Encounter: Payer: Self-pay | Admitting: Family Medicine

## 2020-05-02 NOTE — Telephone Encounter (Signed)
Left message to return call 

## 2020-05-07 ENCOUNTER — Other Ambulatory Visit: Payer: Self-pay | Admitting: Family Medicine

## 2020-05-22 NOTE — Telephone Encounter (Signed)
Patient states the Buspar worked good for her symptoms but caused an allergic reaction, the hydroxyzine knocks her out. Advised patient to schedule an appt to discuss treatment options with Dr Ladona Ridgel. Patient verbalized understanding

## 2020-06-20 ENCOUNTER — Telehealth: Payer: Self-pay | Admitting: Family Medicine

## 2020-06-20 ENCOUNTER — Encounter: Payer: Self-pay | Admitting: Emergency Medicine

## 2020-06-20 ENCOUNTER — Ambulatory Visit
Admission: EM | Admit: 2020-06-20 | Discharge: 2020-06-20 | Disposition: A | Discharge status: Home / Self Care | Payer: Medicaid Other | Attending: Family Medicine | Admitting: Family Medicine

## 2020-06-20 ENCOUNTER — Other Ambulatory Visit: Payer: Self-pay

## 2020-06-20 DIAGNOSIS — N760 Acute vaginitis: Secondary | ICD-10-CM

## 2020-06-20 MED ORDER — FLUCONAZOLE 150 MG PO TABS: ORAL_TABLET | ORAL | 0 refills | Status: DC

## 2020-06-20 NOTE — Telephone Encounter (Signed)
Patient has a yeast infection and would like diflucan called into [facility] [place]

## 2020-06-20 NOTE — ED Provider Notes (Addendum)
[facility]   [id number] 06/20/20 Arrival Time: 1233   CC: VAGINAL DISCHARGE  SUBJECTIVE:  Zoe Evans is a 35 y.o. female who presents with complaints of abrupt vaginal itching and irritation that began x 1 week ago after bubble bath. Has hx vaginal yeast. Describes discharge as thick and white. Has tried Montistat without relief. Reports similar symptoms in the past. Denies fever, chills, nausea, vomiting, abdominal or pelvic pain, urinary symptoms, vaginal odor, vaginal bleeding, dyspareunia, vaginal rashes or lesions.   Patient's last menstrual period was 05/24/2020.   ROS: As per HPI.  All other pertinent ROS negative.     Past Medical History:  Diagnosis Date   Anxiety    Back pain    Depression    Hearing loss in left ear    HPV (human papilloma virus) anogenital infection    Hypertension    Vaginal Pap smear, abnormal    Past Surgical History:  Procedure Laterality Date   CESAREAN SECTION     CESAREAN SECTION     CESAREAN SECTION N/A 02/28/2018   Procedure: CESAREAN SECTION;  Surgeon: Levi Aland, MD;  Location: [facility] BIRTHING SUITES;  Service: Obstetrics;  Laterality: N/A;  Heather RNFA - MD declined RNFA, [place] Cloud to assist   Allergies  Allergen Reactions   Ace Inhibitors Cough   Buspar [Buspirone]     Rash arms and face.   Codeine Nausea And Vomiting and Other (See Comments)    "liquid codeine"   No current facility-administered medications on file prior to encounter.   Current Outpatient Medications on File Prior to Encounter  Medication Sig Dispense Refill   citalopram (CELEXA) 20 MG tablet TAKE 2 TABLETS BY MOUTH IN THE MORNING 180 tablet 0   famotidine (PEPCID) 20 MG tablet Take 1 tablet (20 mg total) by mouth daily. 30 mins prior to eating. 30 tablet 1   hydrOXYzine (ATARAX/VISTARIL) 25 MG tablet Take 1 tablet (25 mg total) by mouth 3 (three) times daily as needed for anxiety. 45 tablet 0   losartan (COZAAR) 100 MG  tablet Take 1 tablet by mouth once daily 90 tablet 0   meloxicam (MOBIC) 15 MG tablet Take 1 tablet (15 mg total) by mouth daily. 30 tablet 0   ondansetron (ZOFRAN ODT) 4 MG disintegrating tablet Take 1 tablet (4 mg total) by mouth every 8 (eight) hours as needed for nausea or vomiting. 20 tablet 0   valACYclovir (VALTREX) 1000 MG tablet Take 1 tablet (1,000 mg total) by mouth daily. 30 tablet 5    Social History   Socioeconomic History   Marital status: Married    Spouse name: Not on file   Number of children: Not on file   Years of education: Not on file   Highest education level: Not on file  Occupational History   Not on file  Tobacco Use   Smoking status: Never Smoker   Smokeless tobacco: Never Used  Vaping Use   Vaping Use: Never used  Substance and Sexual Activity   Alcohol use: Not Currently    Comment: occasional   Drug use: Never   Sexual activity: Yes    Birth control/protection: I.U.D.  Other Topics Concern   Not on file  Social History Narrative   ** Merged History Encounter **       Social Determinants of Health   Financial Resource Strain: Not on file  Food Insecurity: Not on file  Transportation Needs: Not on file  Physical Activity: Not on file  Stress: Not on file  Social Connections: Not on file  Intimate Partner Violence: Not on file   Family History  Problem Relation Age of Onset   Hypertension Mother    Cancer Maternal Aunt    Cancer Maternal Grandmother    Cancer Paternal Grandmother    Breast cancer Maternal Aunt 30       3 maternal cousins as well   Breast cancer Maternal Grandmother 65   Breast cancer Cousin    Breast cancer Cousin    Breast cancer Cousin     OBJECTIVE:  Vitals:   06/20/20 1354 06/20/20 1356  BP:  (!) 147/118  Pulse:  85  Resp:  18  Temp:  98.6 F (37 C)  TempSrc:  Oral  SpO2:  99%  Weight: 200 lb (90.7 kg)   Height: 5\' 3"  (1.6 m)      General appearance: Alert, NAD, appears  stated age Head: NCAT Throat: lips, mucosa, and tongue normal; teeth and gums normal Lungs: CTA bilaterally without adventitious breath sounds Heart: regular rate and rhythm.  Radial pulses 2+ symmetrical bilaterally Back: no CVA tenderness Abdomen: soft, non-tender; bowel sounds normal; no masses or organomegaly; no guarding or rebound tenderness GU: declines  Skin: warm and dry Psychological:  Alert and cooperative. Normal mood and affect.  LABS:  Results for orders placed or performed during the hospital encounter of 09/17/19  Lipase, blood  Result Value Ref Range   Lipase 36 11 - 51 U/L  Comprehensive metabolic panel  Result Value Ref Range   Sodium 137 135 - 145 mmol/L   Potassium 3.6 3.5 - 5.1 mmol/L   Chloride 103 98 - 111 mmol/L   CO2 27 22 - 32 mmol/L   Glucose, Bld 98 70 - 99 mg/dL   BUN 8 6 - 20 mg/dL   Creatinine, Ser 09/19/19 0.44 - 1.00 mg/dL   Calcium 8.8 (L) 8.9 - 10.3 mg/dL   Total Protein 6.9 6.5 - 8.1 g/dL   Albumin 3.9 3.5 - 5.0 g/dL   AST 19 15 - 41 U/L   ALT 17 0 - 44 U/L   Alkaline Phosphatase 49 38 - 126 U/L   Total Bilirubin 0.5 0.3 - 1.2 mg/dL   GFR calc non Af Amer >60 >60 mL/min   GFR calc Af Amer >60 >60 mL/min   Anion gap 7 5 - 15  CBC  Result Value Ref Range   WBC 8.6 4.0 - 10.5 K/uL   RBC 4.61 3.87 - 5.11 MIL/uL   Hemoglobin 12.7 12.0 - 15.0 g/dL   HCT 4.08 14.4 - 81.8 %   MCV 85.7 80.0 - 100.0 fL   MCH 27.5 26.0 - 34.0 pg   MCHC 32.2 30.0 - 36.0 g/dL   RDW 56.3 14.9 - 70.2 %   Platelets 319 150 - 400 K/uL   nRBC 0.0 0.0 - 0.2 %  Urinalysis, Routine w reflex microscopic  Result Value Ref Range   Color, Urine YELLOW YELLOW   APPearance HAZY (A) CLEAR   Specific Gravity, Urine 1.010 1.005 - 1.030   pH 6.0 5.0 - 8.0   Glucose, UA NEGATIVE NEGATIVE mg/dL   Hgb urine dipstick SMALL (A) NEGATIVE   Bilirubin Urine NEGATIVE NEGATIVE   Ketones, ur NEGATIVE NEGATIVE mg/dL   Protein, ur NEGATIVE NEGATIVE mg/dL   Nitrite NEGATIVE NEGATIVE    Leukocytes,Ua NEGATIVE NEGATIVE   RBC / HPF 0-5 0 - 5 RBC/hpf   WBC, UA 0-5 0 - 5 WBC/hpf  Bacteria, UA MANY (A) NONE SEEN   Squamous Epithelial / LPF 0-5 0 - 5   Mucus PRESENT   Pregnancy, urine  Result Value Ref Range   Preg Test, Ur NEGATIVE NEGATIVE    Labs Reviewed  CERVICOVAGINAL ANCILLARY ONLY    ASSESSMENT & PLAN:  1. Acute vaginitis     Meds ordered this encounter  Medications   fluconazole (DIFLUCAN) 150 MG tablet    Sig: Take one tablet at the onset of symptoms. If symptoms are still present 3 days later, take the second tablet.    Dispense:  2 tablet    Refill:  0    Order Specific Question:   Supervising Provider    Answer:   Merrilee Jansky [[id number]]    Pending: Labs Reviewed  CERVICOVAGINAL ANCILLARY ONLY   Will treat for yeast given history and symptoms Prescribed diflucan 150 mg once daily and then second dose 72 hours later Take medications as prescribed and to completion Follow up with PCP or [facility] if symptoms persists Return here or go to ER if you have any new or worsening symptoms fever, chills, nausea, vomiting, abdominal or pelvic pain, painful intercourse, persistent symptoms despite treatment Reviewed expectations re: course of current medical issues. Questions answered. Outlined signs and symptoms indicating need for more acute intervention. Patient verbalized understanding. After Visit Summary given.       Moshe Cipro, NP 06/20/20 1430    Moshe Cipro, NP 06/20/20 1430

## 2020-06-20 NOTE — ED Triage Notes (Signed)
Pt states she took a bubble bath x1 week again and now is having vaginal irritation, itching.  Has used monistat with no relief.

## 2020-06-20 NOTE — Discharge Instructions (Addendum)
I have sent in fluconazole in case of yeast. Take one tablet at the onset of symptoms. If symptoms are still present in 3 days, take the second tablet.   Follow up with this office or with primary care if symptoms are persisting.  Follow up in the ER for high fever, trouble swallowing, trouble breathing, other concerning symptoms.    

## 2020-06-21 ENCOUNTER — Other Ambulatory Visit: Payer: Self-pay | Admitting: Nurse Practitioner

## 2020-06-21 NOTE — Telephone Encounter (Signed)
Has already been ordered.

## 2020-07-07 ENCOUNTER — Ambulatory Visit (INDEPENDENT_AMBULATORY_CARE_PROVIDER_SITE_OTHER): Payer: Self-pay | Admitting: Family Medicine

## 2020-07-07 ENCOUNTER — Other Ambulatory Visit: Payer: Self-pay

## 2020-07-07 ENCOUNTER — Encounter: Payer: Self-pay | Admitting: Family Medicine

## 2020-07-07 VITALS — BP 132/94 | HR 92 | Temp 97.5°F | Ht 63.0 in | Wt 209.0 lb

## 2020-07-07 DIAGNOSIS — I10 Essential (primary) hypertension: Secondary | ICD-10-CM

## 2020-07-07 DIAGNOSIS — N3001 Acute cystitis with hematuria: Secondary | ICD-10-CM | POA: Insufficient documentation

## 2020-07-07 LAB — POCT URINALYSIS DIPSTICK
Spec Grav, UA: 1.02 (ref 1.010–1.025)
pH, UA: 6 (ref 5.0–8.0)

## 2020-07-07 MED ORDER — AMLODIPINE BESYLATE 2.5 MG PO TABS: 2.5000 mg | ORAL_TABLET | Freq: Every day | ORAL | 0 refills | Status: DC

## 2020-07-07 MED ORDER — NITROFURANTOIN MONOHYD MACRO 100 MG PO CAPS: 100.0000 mg | ORAL_CAPSULE | Freq: Two times a day (BID) | ORAL | 0 refills | Status: DC

## 2020-07-07 NOTE — Progress Notes (Signed)
Patient ID: Zoe Evans, female    DOB: [birth year], 35 y.o.   MRN: [id number]   No chief complaint on file.  Subjective:  CC: elevated blood pressure and flank pain  This is a new problem.  Presents today for follow-up on uncontrolled blood pressure.  Reports that she has been checking her blood pressure at home, and the highest has been 173/118.  Has been running on average 156/104.  Does report headaches, she has migraines but this is not a migraine.  Has a lot of stressors going on her life, she is a single parent of 2 children, works full-time and going through a divorce.  Also reports during review of systems that she is having some bilateral flank pain.  Denies fever, chills, chest pain, shortness of breath.  Has a history of colitis, endorses abdominal pain with constipation diarrhea.  Reports that the bilateral flank pain has been present for some time now.   issues with blood pressure.    Medical History Zoe Evans has a past medical history of Anxiety, Back pain, Depression, Hearing loss in left ear, HPV (human papilloma virus) anogenital infection, Hypertension, and Vaginal Pap smear, abnormal.   Outpatient Encounter Medications as of 07/07/2020  Medication Sig  . amLODipine (NORVASC) 2.5 MG tablet Take 1 tablet (2.5 mg total) by mouth daily.  . citalopram (CELEXA) 20 MG tablet TAKE 2 TABLETS BY MOUTH IN THE MORNING  . famotidine (PEPCID) 20 MG tablet Take 1 tablet (20 mg total) by mouth daily. 30 mins prior to eating.  . hydrOXYzine (ATARAX/VISTARIL) 25 MG tablet Take 1 tablet (25 mg total) by mouth 3 (three) times daily as needed for anxiety.  Marland Kitchen losartan (COZAAR) 100 MG tablet Take 1 tablet by mouth once daily  . meloxicam (MOBIC) 15 MG tablet Take 1 tablet (15 mg total) by mouth daily.  . nitrofurantoin, macrocrystal-monohydrate, (MACROBID) 100 MG capsule Take 1 capsule (100 mg total) by mouth 2 (two) times daily.  . ondansetron (ZOFRAN ODT) 4 MG disintegrating tablet Take 1  tablet (4 mg total) by mouth every 8 (eight) hours as needed for nausea or vomiting.  . valACYclovir (VALTREX) 1000 MG tablet Take 1 tablet (1,000 mg total) by mouth daily.  . [DISCONTINUED] fluconazole (DIFLUCAN) 150 MG tablet Take one tablet at the onset of symptoms. If symptoms are still present 3 days later, take the second tablet.   No facility-administered encounter medications on file as of 07/07/2020.     Review of Systems  Constitutional: Positive for fatigue. Negative for chills and fever.  HENT: Negative for congestion.   Respiratory: Negative for cough and shortness of breath.   Cardiovascular: Negative for chest pain.  Gastrointestinal: Positive for abdominal pain, constipation and diarrhea. Negative for nausea and vomiting.       Hx col litis   Genitourinary: Negative for dysuria.  Musculoskeletal: Positive for back pain. Negative for joint swelling and myalgias.       Flank pain.  Neurological: Positive for headaches.       Hx migraine, headaches not like migraines.   Psychiatric/Behavioral:       Has stressors at home. Divorce, single parent  Of young child.      Vitals BP (!) 132/94   Pulse 92   Temp (!) 97.5 F (36.4 C)   Ht 5\' 3"  (1.6 m)   Wt 209 lb (94.8 kg)   SpO2 99%   BMI 37.02 kg/m   Objective:   Physical Exam Vitals reviewed.  Constitutional:      Appearance: Normal appearance.  Cardiovascular:     Rate and Rhythm: Normal rate and regular rhythm.     Heart sounds: Normal heart sounds.  Pulmonary:     Effort: Pulmonary effort is normal.     Breath sounds: Normal breath sounds.  Abdominal:     Tenderness: There is right CVA tenderness and left CVA tenderness.  Skin:    General: Skin is warm and dry.  Neurological:     General: No focal deficit present.     Mental Status: She is alert.  Psychiatric:        Behavior: Behavior normal.    Results for orders placed or performed during the hospital encounter of 09/17/19  Lipase, blood   Result Value Ref Range   Lipase 36 11 - 51 U/L  Comprehensive metabolic panel  Result Value Ref Range   Sodium 137 135 - 145 mmol/L   Potassium 3.6 3.5 - 5.1 mmol/L   Chloride 103 98 - 111 mmol/L   CO2 27 22 - 32 mmol/L   Glucose, Bld 98 70 - 99 mg/dL   BUN 8 6 - 20 mg/dL   Creatinine, Ser 7.82 0.44 - 1.00 mg/dL   Calcium 8.8 (L) 8.9 - 10.3 mg/dL   Total Protein 6.9 6.5 - 8.1 g/dL   Albumin 3.9 3.5 - 5.0 g/dL   AST 19 15 - 41 U/L   ALT 17 0 - 44 U/L   Alkaline Phosphatase 49 38 - 126 U/L   Total Bilirubin 0.5 0.3 - 1.2 mg/dL   GFR calc non Af Amer >60 >60 mL/min   GFR calc Af Amer >60 >60 mL/min   Anion gap 7 5 - 15  CBC  Result Value Ref Range   WBC 8.6 4.0 - 10.5 K/uL   RBC 4.61 3.87 - 5.11 MIL/uL   Hemoglobin 12.7 12.0 - 15.0 g/dL   HCT 95.6 21.3 - 08.6 %   MCV 85.7 80.0 - 100.0 fL   MCH 27.5 26.0 - 34.0 pg   MCHC 32.2 30.0 - 36.0 g/dL   RDW 57.8 46.9 - 62.9 %   Platelets 319 150 - 400 K/uL   nRBC 0.0 0.0 - 0.2 %  Urinalysis, Routine w reflex microscopic  Result Value Ref Range   Color, Urine YELLOW YELLOW   APPearance HAZY (A) CLEAR   Specific Gravity, Urine 1.010 1.005 - 1.030   pH 6.0 5.0 - 8.0   Glucose, UA NEGATIVE NEGATIVE mg/dL   Hgb urine dipstick SMALL (A) NEGATIVE   Bilirubin Urine NEGATIVE NEGATIVE   Ketones, ur NEGATIVE NEGATIVE mg/dL   Protein, ur NEGATIVE NEGATIVE mg/dL   Nitrite NEGATIVE NEGATIVE   Leukocytes,Ua NEGATIVE NEGATIVE   RBC / HPF 0-5 0 - 5 RBC/hpf   WBC, UA 0-5 0 - 5 WBC/hpf   Bacteria, UA MANY (A) NONE SEEN   Squamous Epithelial / LPF 0-5 0 - 5   Mucus PRESENT   Pregnancy, urine  Result Value Ref Range   Preg Test, Ur NEGATIVE NEGATIVE     Assessment and Plan   1. Elevated blood pressure reading in office with diagnosis of hypertension - Comprehensive Metabolic Panel (CMET) - amLODipine (NORVASC) 2.5 MG tablet; Take 1 tablet (2.5 mg total) by mouth daily.  Dispense: 30 tablet; Refill: 0  2. Acute cystitis with  hematuria - nitrofurantoin, macrocrystal-monohydrate, (MACROBID) 100 MG capsule; Take 1 capsule (100 mg total) by mouth 2 (two) times daily.  Dispense: 20  capsule; Refill: 0   Elevated blood pressure, on losartan.  Will add amlodipine, she will continue to keep a blood pressure log, she will follow-up in 3 weeks and bring this log at that follow-up.  A few days before this visit she will get a complete metabolic panel.  Instructed on sodium restriction, information given on the Dash diet.  She will also stop using ibuprofen, which she has been using regularly.  Urine dip done positive for infection, will start antibiotic for 10 days.  Agrees with plan of care discussed today. Understands warning signs to seek further care: chest pain, shortness of breath, any significant change in health.  Understands to follow-up in 3 weeks for blood pressure check, will recheck urine at that time to ensure resolution of symptoms, and hematuria.  She is due for her annual wellness, and Pap smear, she reports that her Medicaid plan does not pay for a Pap smear, and she is trying to save money for this to be done.  I encouraged her to do so, she does report she is positive high risk HPV.   Dorena Bodo, NP 07/07/2020

## 2020-07-07 NOTE — Patient Instructions (Signed)
Start amlodipine and continue to take blood pressure and log it. Get blood work a few days before your follow-up in 3 weeks and bring blood pressure log. Watch sodium in diet. Stop taking ibuprofen and take Tylenol (no more than 3000 mg in 24 hours)   DASH Eating Plan DASH stands for Dietary Approaches to Stop Hypertension. The DASH eating plan is a healthy eating plan that has been shown to:  Reduce high blood pressure (hypertension).  Reduce your risk for type 2 diabetes, heart disease, and stroke.  Help with weight loss. What are tips for following this plan? Reading food labels  Check food labels for the amount of salt (sodium) per serving. Choose foods with less than 5 percent of the Daily Value of sodium. Generally, foods with less than 300 milligrams (mg) of sodium per serving fit into this eating plan.  To find whole grains, look for the word "whole" as the first word in the ingredient list. Shopping  Buy products labeled as "low-sodium" or "no salt added."  Buy fresh foods. Avoid canned foods and pre-made or frozen meals. Cooking  Avoid adding salt when cooking. Use salt-free seasonings or herbs instead of table salt or sea salt. Check with your health care provider or pharmacist before using salt substitutes.  Do not fry foods. Cook foods using healthy methods such as baking, boiling, grilling, roasting, and broiling instead.  Cook with heart-healthy oils, such as olive, canola, avocado, soybean, or sunflower oil. Meal planning  Eat a balanced diet that includes: ? 4 or more servings of fruits and 4 or more servings of vegetables each day. Try to fill one-half of your plate with fruits and vegetables. ? 6-8 servings of whole grains each day. ? Less than 6 oz (170 g) of lean meat, poultry, or fish each day. A 3-oz (85-g) serving of meat is about the same size as a deck of cards. One egg equals 1 oz (28 g). ? 2-3 servings of low-fat dairy each day. One serving is 1 cup  (237 mL). ? 1 serving of nuts, seeds, or beans 5 times each week. ? 2-3 servings of heart-healthy fats. Healthy fats called omega-3 fatty acids are found in foods such as walnuts, flaxseeds, fortified milks, and eggs. These fats are also found in cold-water fish, such as sardines, salmon, and mackerel.  Limit how much you eat of: ? Canned or prepackaged foods. ? Food that is high in trans fat, such as some fried foods. ? Food that is high in saturated fat, such as fatty meat. ? Desserts and other sweets, sugary drinks, and other foods with added sugar. ? Full-fat dairy products.  Do not salt foods before eating.  Do not eat more than 4 egg yolks a week.  Try to eat at least 2 vegetarian meals a week.  Eat more home-cooked food and less restaurant, buffet, and fast food.   Lifestyle  When eating at a restaurant, ask that your food be prepared with less salt or no salt, if possible.  If you drink alcohol: ? Limit how much you use to:  0-1 drink a day for women who are not pregnant.  0-2 drinks a day for men. ? Be aware of how much alcohol is in your drink. In the [place] one drink equals one 12 oz bottle of beer (355 mL), one 5 oz glass of wine (148 mL), or one 1 oz glass of hard liquor (44 mL). General information  Avoid eating more than 2,300  mg of salt a day. If you have hypertension, you may need to reduce your sodium intake to 1,500 mg a day.  Work with your health care provider to maintain a healthy body weight or to lose weight. Ask what an ideal weight is for you.  Get at least 30 minutes of exercise that causes your heart to beat faster (aerobic exercise) most days of the week. Activities may include walking, swimming, or biking.  Work with your health care provider or dietitian to adjust your eating plan to your individual calorie needs. What foods should I eat? Fruits All fresh, dried, or frozen fruit. Canned fruit in natural juice (without added  sugar). Vegetables Fresh or frozen vegetables (raw, steamed, roasted, or grilled). Low-sodium or reduced-sodium tomato and vegetable juice. Low-sodium or reduced-sodium tomato sauce and tomato paste. Low-sodium or reduced-sodium canned vegetables. Grains Whole-grain or whole-wheat bread. Whole-grain or whole-wheat pasta. Brown rice. Orpah Cobb. Bulgur. Whole-grain and low-sodium cereals. Pita bread. Low-fat, low-sodium crackers. Whole-wheat flour tortillas. Meats and other proteins Skinless chicken or [place]. Ground chicken or [place]. Pork with fat trimmed off. Fish and seafood. Egg whites. Dried beans, peas, or lentils. Unsalted nuts, nut butters, and seeds. Unsalted canned beans. Lean cuts of beef with fat trimmed off. Low-sodium, lean precooked or cured meat, such as sausages or meat loaves. Dairy Low-fat (1%) or fat-free (skim) milk. Reduced-fat, low-fat, or fat-free cheeses. Nonfat, low-sodium ricotta or cottage cheese. Low-fat or nonfat yogurt. Low-fat, low-sodium cheese. Fats and oils Soft margarine without trans fats. Vegetable oil. Reduced-fat, low-fat, or light mayonnaise and salad dressings (reduced-sodium). Canola, safflower, olive, avocado, soybean, and sunflower oils. Avocado. Seasonings and condiments Herbs. Spices. Seasoning mixes without salt. Other foods Unsalted popcorn and pretzels. Fat-free sweets. The items listed above may not be a complete list of foods and beverages you can eat. Contact a dietitian for more information. What foods should I avoid? Fruits Canned fruit in a light or heavy syrup. Fried fruit. Fruit in cream or butter sauce. Vegetables Creamed or fried vegetables. Vegetables in a cheese sauce. Regular canned vegetables (not low-sodium or reduced-sodium). Regular canned tomato sauce and paste (not low-sodium or reduced-sodium). Regular tomato and vegetable juice (not low-sodium or reduced-sodium). Rosita Fire. Olives. Grains Baked goods made with fat, such  as croissants, muffins, or some breads. Dry pasta or rice meal packs. Meats and other proteins Fatty cuts of meat. Ribs. Fried meat. Tomasa Blase. Bologna, salami, and other precooked or cured meats, such as sausages or meat loaves. Fat from the back of a pig (fatback). Bratwurst. Salted nuts and seeds. Canned beans with added salt. Canned or smoked fish. Whole eggs or egg yolks. Chicken or [place] with skin. Dairy Whole or 2% milk, cream, and half-and-half. Whole or full-fat cream cheese. Whole-fat or sweetened yogurt. Full-fat cheese. Nondairy creamers. Whipped toppings. Processed cheese and cheese spreads. Fats and oils Butter. Stick margarine. Lard. Shortening. Ghee. Bacon fat. Tropical oils, such as coconut, palm kernel, or palm oil. Seasonings and condiments Onion salt, garlic salt, seasoned salt, table salt, and sea salt. Worcestershire sauce. Tartar sauce. Barbecue sauce. Teriyaki sauce. Soy sauce, including reduced-sodium. Steak sauce. Canned and packaged gravies. Fish sauce. Oyster sauce. Cocktail sauce. Store-bought horseradish. Ketchup. Mustard. Meat flavorings and tenderizers. Bouillon cubes. Hot sauces. Pre-made or packaged marinades. Pre-made or packaged taco seasonings. Relishes. Regular salad dressings. Other foods Salted popcorn and pretzels. The items listed above may not be a complete list of foods and beverages you should avoid. Contact a dietitian for more information.  Where to find more information  National Heart, Lung, and Blood Institute: PopSteam.is  American Heart Association: www.heart.org  Academy of Nutrition and Dietetics: www.eatright.org  National Kidney Foundation: www.kidney.org Summary  The DASH eating plan is a healthy eating plan that has been shown to reduce high blood pressure (hypertension). It may also reduce your risk for type 2 diabetes, heart disease, and stroke.  When on the DASH eating plan, aim to eat more fresh fruits and vegetables, whole  grains, lean proteins, low-fat dairy, and heart-healthy fats.  With the DASH eating plan, you should limit salt (sodium) intake to 2,300 mg a day. If you have hypertension, you may need to reduce your sodium intake to 1,500 mg a day.  Work with your health care provider or dietitian to adjust your eating plan to your individual calorie needs. This information is not intended to replace advice given to you by your health care provider. Make sure you discuss any questions you have with your health care provider. Document Revised: 04/20/2019 Document Reviewed: 04/20/2019 Elsevier Patient Education  2021 Elsevier Inc.       Hypertension, Adult Hypertension is another name for high blood pressure. High blood pressure forces your heart to work harder to pump blood. This can cause problems over time. There are two numbers in a blood pressure reading. There is a top number (systolic) over a bottom number (diastolic). It is best to have a blood pressure that is below 120/80. Healthy choices can help lower your blood pressure, or you may need medicine to help lower it. What are the causes? The cause of this condition is not known. Some conditions may be related to high blood pressure. What increases the risk?  Smoking.  Having type 2 diabetes mellitus, high cholesterol, or both.  Not getting enough exercise or physical activity.  Being overweight.  Having too much fat, sugar, calories, or salt (sodium) in your diet.  Drinking too much alcohol.  Having long-term (chronic) kidney disease.  Having a family history of high blood pressure.  Age. Risk increases with age.  Race. You may be at higher risk if you are African American.  Gender. Men are at higher risk than women before age 36. After age 45, women are at higher risk than men.  Having obstructive sleep apnea.  Stress. What are the signs or symptoms?  High blood pressure may not cause symptoms. Very high blood pressure  (hypertensive crisis) may cause: ? Headache. ? Feelings of worry or nervousness (anxiety). ? Shortness of breath. ? Nosebleed. ? A feeling of being sick to your stomach (nausea). ? Throwing up (vomiting). ? Changes in how you see. ? Very bad chest pain. ? Seizures. How is this treated?  This condition is treated by making healthy lifestyle changes, such as: ? Eating healthy foods. ? Exercising more. ? Drinking less alcohol.  Your health care provider may prescribe medicine if lifestyle changes are not enough to get your blood pressure under control, and if: ? Your top number is above 130. ? Your bottom number is above 80.  Your personal target blood pressure may vary. Follow these instructions at home: Eating and drinking  If told, follow the DASH eating plan. To follow this plan: ? Fill one half of your plate at each meal with fruits and vegetables. ? Fill one fourth of your plate at each meal with whole grains. Whole grains include whole-wheat pasta, brown rice, and whole-grain bread. ? Eat or drink low-fat dairy products, such as  skim milk or low-fat yogurt. ? Fill one fourth of your plate at each meal with low-fat (lean) proteins. Low-fat proteins include fish, chicken without skin, eggs, beans, and tofu. ? Avoid fatty meat, cured and processed meat, or chicken with skin. ? Avoid pre-made or processed food.  Eat less than 1,500 mg of salt each day.  Do not drink alcohol if: ? Your doctor tells you not to drink. ? You are pregnant, may be pregnant, or are planning to become pregnant.  If you drink alcohol: ? Limit how much you use to:  0-1 drink a day for women.  0-2 drinks a day for men. ? Be aware of how much alcohol is in your drink. In the [place] one drink equals one 12 oz bottle of beer (355 mL), one 5 oz glass of wine (148 mL), or one 1 oz glass of hard liquor (44 mL).   Lifestyle  Work with your doctor to stay at a healthy weight or to lose weight. Ask your  doctor what the best weight is for you.  Get at least 30 minutes of exercise most days of the week. This may include walking, swimming, or biking.  Get at least 30 minutes of exercise that strengthens your muscles (resistance exercise) at least 3 days a week. This may include lifting weights or doing Pilates.  Do not use any products that contain nicotine or tobacco, such as cigarettes, e-cigarettes, and chewing tobacco. If you need help quitting, ask your doctor.  Check your blood pressure at home as told by your doctor.  Keep all follow-up visits as told by your doctor. This is important.   Medicines  Take over-the-counter and prescription medicines only as told by your doctor. Follow directions carefully.  Do not skip doses of blood pressure medicine. The medicine does not work as well if you skip doses. Skipping doses also puts you at risk for problems.  Ask your doctor about side effects or reactions to medicines that you should watch for. Contact a doctor if you:  Think you are having a reaction to the medicine you are taking.  Have headaches that keep coming back (recurring).  Feel dizzy.  Have swelling in your ankles.  Have trouble with your vision. Get help right away if you:  Get a very bad headache.  Start to feel mixed up (confused).  Feel weak or numb.  Feel faint.  Have very bad pain in your: ? Chest. ? Belly (abdomen).  Throw up more than once.  Have trouble breathing. Summary  Hypertension is another name for high blood pressure.  High blood pressure forces your heart to work harder to pump blood.  For most people, a normal blood pressure is less than 120/80.  Making healthy choices can help lower blood pressure. If your blood pressure does not get lower with healthy choices, you may need to take medicine. This information is not intended to replace advice given to you by your health care provider. Make sure you discuss any questions you have  with your health care provider. Document Revised: 01/25/2018 Document Reviewed: 01/25/2018 Elsevier Patient Education  2021 [employer].

## 2020-07-09 ENCOUNTER — Encounter: Payer: Self-pay | Admitting: Family Medicine

## 2020-07-09 LAB — URINE CULTURE

## 2020-07-09 LAB — SPECIMEN STATUS REPORT

## 2020-07-10 NOTE — Telephone Encounter (Signed)
Left message to return call 

## 2020-07-11 NOTE — Telephone Encounter (Signed)
Patient states her blood pressure is running 140/93 and her back pain still continues to be a constant achy throbbing kind of pain

## 2020-07-23 LAB — COMPREHENSIVE METABOLIC PANEL
ALT: 29 IU/L (ref 0–32)
AST: 20 IU/L (ref 0–40)
Albumin/Globulin Ratio: 1.9 (ref 1.2–2.2)
Albumin: 4.5 g/dL (ref 3.8–4.8)
Alkaline Phosphatase: 68 IU/L (ref 44–121)
BUN/Creatinine Ratio: 14 (ref 9–23)
BUN: 11 mg/dL (ref 6–20)
Bilirubin Total: 0.2 mg/dL (ref 0.0–1.2)
CO2: 20 mmol/L (ref 20–29)
Calcium: 9.4 mg/dL (ref 8.7–10.2)
Chloride: 102 mmol/L (ref 96–106)
Creatinine, Ser: 0.77 mg/dL (ref 0.57–1.00)
GFR calc Af Amer: 116 mL/min/{1.73_m2} (ref >59)
GFR calc non Af Amer: 100 mL/min/{1.73_m2} (ref >59)
Globulin, Total: 2.4 g/dL (ref 1.5–4.5)
Glucose: 84 mg/dL (ref 65–99)
Potassium: 3.4 mmol/L — ABNORMAL LOW (ref 3.5–5.2)
Sodium: 140 mmol/L (ref 134–144)
Total Protein: 6.9 g/dL (ref 6.0–8.5)

## 2020-07-28 ENCOUNTER — Ambulatory Visit (INDEPENDENT_AMBULATORY_CARE_PROVIDER_SITE_OTHER): Payer: Self-pay | Admitting: Family Medicine

## 2020-07-28 ENCOUNTER — Other Ambulatory Visit: Payer: Self-pay

## 2020-07-28 ENCOUNTER — Encounter: Payer: Self-pay | Admitting: Family Medicine

## 2020-07-28 VITALS — BP 120/90 | HR 78 | Temp 97.7°F | Ht 63.0 in | Wt 208.2 lb

## 2020-07-28 DIAGNOSIS — I10 Essential (primary) hypertension: Secondary | ICD-10-CM

## 2020-07-28 MED ORDER — AMLODIPINE BESYLATE 5 MG PO TABS: ORAL_TABLET | ORAL | 1 refills | Status: DC

## 2020-07-28 NOTE — Progress Notes (Signed)
Patient ID: MARZELLA MIRACLE, female    DOB: 03-18-1986, 35 y.o.   MRN: [id number]   Chief Complaint  Patient presents with  . Hypertension    Follow up   Subjective:  CC: elevated blood pressure  This is not a new problem. Presents today to follow-up on elevated blood pressure. Reports that she started her amlodipine, taking her blood pressures at home, initially blood pressures were still elevated, then she started getting some lower numbers. Lost her blood pressure log, did not have this data with her today at this visit. Blood pressure at this visit 120/90. Denies fever, chills, chest pain, shortness of breath, vision changes, headaches. Has a very stressful life, single parent,  full-time Sales executive.    Medical History Khaniya has a past medical history of Anxiety, Back pain, Depression, Hearing loss in left ear, HPV (human papilloma virus) anogenital infection, Hypertension, and Vaginal Pap smear, abnormal.   Outpatient Encounter Medications as of 07/28/2020  Medication Sig  . amLODipine (NORVASC) 5 MG tablet Take one tablet by mouth daily  . citalopram (CELEXA) 20 MG tablet TAKE 2 TABLETS BY MOUTH IN THE MORNING  . famotidine (PEPCID) 20 MG tablet Take 1 tablet (20 mg total) by mouth daily. 30 mins prior to eating.  . hydrOXYzine (ATARAX/VISTARIL) 25 MG tablet Take 1 tablet (25 mg total) by mouth 3 (three) times daily as needed for anxiety.  Marland Kitchen losartan (COZAAR) 100 MG tablet Take 1 tablet by mouth once daily  . meloxicam (MOBIC) 15 MG tablet Take 1 tablet (15 mg total) by mouth daily.  . nitrofurantoin, macrocrystal-monohydrate, (MACROBID) 100 MG capsule Take 1 capsule (100 mg total) by mouth 2 (two) times daily. (Patient not taking: Reported on 07/28/2020)  . ondansetron (ZOFRAN ODT) 4 MG disintegrating tablet Take 1 tablet (4 mg total) by mouth every 8 (eight) hours as needed for nausea or vomiting.  . valACYclovir (VALTREX) 1000 MG tablet Take 1 tablet (1,000 mg total) by  mouth daily.  . [DISCONTINUED] amLODipine (NORVASC) 2.5 MG tablet Take 1 tablet (2.5 mg total) by mouth daily.   No facility-administered encounter medications on file as of 07/28/2020.     Review of Systems  Constitutional: Negative for chills, fatigue and fever.  Respiratory: Negative for chest tightness and shortness of breath.   Cardiovascular: Negative for chest pain and leg swelling.  Neurological: Negative for dizziness, light-headedness and headaches.     Vitals BP 120/90   Pulse 78   Temp 97.7 F (36.5 C) (Oral)   Ht 5\' 3"  (1.6 m)   Wt 208 lb 3.2 oz (94.4 kg)   SpO2 99%   BMI 36.88 kg/m   Objective:   Physical Exam Vitals reviewed.  Constitutional:      Appearance: Normal appearance.  Cardiovascular:     Rate and Rhythm: Normal rate and regular rhythm.     Heart sounds: Normal heart sounds.  Pulmonary:     Effort: Pulmonary effort is normal.     Breath sounds: Normal breath sounds.  Skin:    General: Skin is warm and dry.  Neurological:     General: No focal deficit present.     Mental Status: She is alert.  Psychiatric:        Behavior: Behavior normal.      Assessment and Plan   1. Essential hypertension - amLODipine (NORVASC) 5 MG tablet; Take one tablet by mouth daily  Dispense: 30 tablet; Refill: 1  2. Elevated blood pressure reading in  office with diagnosis of hypertension - amLODipine (NORVASC) 5 MG tablet; Take one tablet by mouth daily  Dispense: 30 tablet; Refill: 1   Hypertension Medication compliance: takes as prescribed Denies chest pain, shortness of breath, lower extremity edema, vision changes, headaches.  Pertinent lab work: kidney and liver function normal (done 07/22/20).  ASCVD 10- year risk score: not at this visit.  Monitoring: Return in 1 month. Side effects: None. Labs reviewed in detail. Continue current medication regimen: Increase amlodipine to 5 mg once daily, take blood pressures few times a week.  Lifestyle  modification discussed. Watching salt in diet. Trying to fit exercise in.   Agrees with plan of care discussed today. Understands warning signs to seek further care: chest pain, shortness of breath, any significant change in health.  Understands to follow-up in 1 month, sooner if needed. Bring blood pressure log at 1 month follow-up visit.   Dorena Bodo, NP 07/28/20

## 2020-07-28 NOTE — Patient Instructions (Signed)
Take blood pressure a few times per week and bring log at next visit.       DASH Eating Plan DASH stands for Dietary Approaches to Stop Hypertension. The DASH eating plan is a healthy eating plan that has been shown to:  Reduce high blood pressure (hypertension).  Reduce your risk for type 2 diabetes, heart disease, and stroke.  Help with weight loss. What are tips for following this plan? Reading food labels  Check food labels for the amount of salt (sodium) per serving. Choose foods with less than 5 percent of the Daily Value of sodium. Generally, foods with less than 300 milligrams (mg) of sodium per serving fit into this eating plan.  To find whole grains, look for the word "whole" as the first word in the ingredient list. Shopping  Buy products labeled as "low-sodium" or "no salt added."  Buy fresh foods. Avoid canned foods and pre-made or frozen meals. Cooking  Avoid adding salt when cooking. Use salt-free seasonings or herbs instead of table salt or sea salt. Check with your health care provider or pharmacist before using salt substitutes.  Do not fry foods. Cook foods using healthy methods such as baking, boiling, grilling, roasting, and broiling instead.  Cook with heart-healthy oils, such as olive, canola, avocado, soybean, or sunflower oil. Meal planning  Eat a balanced diet that includes: ? 4 or more servings of fruits and 4 or more servings of vegetables each day. Try to fill one-half of your plate with fruits and vegetables. ? 6-8 servings of whole grains each day. ? Less than 6 oz (170 g) of lean meat, poultry, or fish each day. A 3-oz (85-g) serving of meat is about the same size as a deck of cards. One egg equals 1 oz (28 g). ? 2-3 servings of low-fat dairy each day. One serving is 1 cup (237 mL). ? 1 serving of nuts, seeds, or beans 5 times each week. ? 2-3 servings of heart-healthy fats. Healthy fats called omega-3 fatty acids are found in foods such as  walnuts, flaxseeds, fortified milks, and eggs. These fats are also found in cold-water fish, such as sardines, salmon, and mackerel.  Limit how much you eat of: ? Canned or prepackaged foods. ? Food that is high in trans fat, such as some fried foods. ? Food that is high in saturated fat, such as fatty meat. ? Desserts and other sweets, sugary drinks, and other foods with added sugar. ? Full-fat dairy products.  Do not salt foods before eating.  Do not eat more than 4 egg yolks a week.  Try to eat at least 2 vegetarian meals a week.  Eat more home-cooked food and less restaurant, buffet, and fast food.   Lifestyle  When eating at a restaurant, ask that your food be prepared with less salt or no salt, if possible.  If you drink alcohol: ? Limit how much you use to:  0-1 drink a day for women who are not pregnant.  0-2 drinks a day for men. ? Be aware of how much alcohol is in your drink. In the [place] one drink equals one 12 oz bottle of beer (355 mL), one 5 oz glass of wine (148 mL), or one 1 oz glass of hard liquor (44 mL). General information  Avoid eating more than 2,300 mg of salt a day. If you have hypertension, you may need to reduce your sodium intake to 1,500 mg a day.  Work with your health care  provider to maintain a healthy body weight or to lose weight. Ask what an ideal weight is for you.  Get at least 30 minutes of exercise that causes your heart to beat faster (aerobic exercise) most days of the week. Activities may include walking, swimming, or biking.  Work with your health care provider or dietitian to adjust your eating plan to your individual calorie needs. What foods should I eat? Fruits All fresh, dried, or frozen fruit. Canned fruit in natural juice (without added sugar). Vegetables Fresh or frozen vegetables (raw, steamed, roasted, or grilled). Low-sodium or reduced-sodium tomato and vegetable juice. Low-sodium or reduced-sodium tomato sauce and tomato  paste. Low-sodium or reduced-sodium canned vegetables. Grains Whole-grain or whole-wheat bread. Whole-grain or whole-wheat pasta. Brown rice. Orpah Cobb. Bulgur. Whole-grain and low-sodium cereals. Pita bread. Low-fat, low-sodium crackers. Whole-wheat flour tortillas. Meats and other proteins Skinless chicken or [place]. Ground chicken or [place]. Pork with fat trimmed off. Fish and seafood. Egg whites. Dried beans, peas, or lentils. Unsalted nuts, nut butters, and seeds. Unsalted canned beans. Lean cuts of beef with fat trimmed off. Low-sodium, lean precooked or cured meat, such as sausages or meat loaves. Dairy Low-fat (1%) or fat-free (skim) milk. Reduced-fat, low-fat, or fat-free cheeses. Nonfat, low-sodium ricotta or cottage cheese. Low-fat or nonfat yogurt. Low-fat, low-sodium cheese. Fats and oils Soft margarine without trans fats. Vegetable oil. Reduced-fat, low-fat, or light mayonnaise and salad dressings (reduced-sodium). Canola, safflower, olive, avocado, soybean, and sunflower oils. Avocado. Seasonings and condiments Herbs. Spices. Seasoning mixes without salt. Other foods Unsalted popcorn and pretzels. Fat-free sweets. The items listed above may not be a complete list of foods and beverages you can eat. Contact a dietitian for more information. What foods should I avoid? Fruits Canned fruit in a light or heavy syrup. Fried fruit. Fruit in cream or butter sauce. Vegetables Creamed or fried vegetables. Vegetables in a cheese sauce. Regular canned vegetables (not low-sodium or reduced-sodium). Regular canned tomato sauce and paste (not low-sodium or reduced-sodium). Regular tomato and vegetable juice (not low-sodium or reduced-sodium). Rosita Fire. Olives. Grains Baked goods made with fat, such as croissants, muffins, or some breads. Dry pasta or rice meal packs. Meats and other proteins Fatty cuts of meat. Ribs. Fried meat. Tomasa Blase. Bologna, salami, and other precooked or cured meats,  such as sausages or meat loaves. Fat from the back of a pig (fatback). Bratwurst. Salted nuts and seeds. Canned beans with added salt. Canned or smoked fish. Whole eggs or egg yolks. Chicken or [place] with skin. Dairy Whole or 2% milk, cream, and half-and-half. Whole or full-fat cream cheese. Whole-fat or sweetened yogurt. Full-fat cheese. Nondairy creamers. Whipped toppings. Processed cheese and cheese spreads. Fats and oils Butter. Stick margarine. Lard. Shortening. Ghee. Bacon fat. Tropical oils, such as coconut, palm kernel, or palm oil. Seasonings and condiments Onion salt, garlic salt, seasoned salt, table salt, and sea salt. Worcestershire sauce. Tartar sauce. Barbecue sauce. Teriyaki sauce. Soy sauce, including reduced-sodium. Steak sauce. Canned and packaged gravies. Fish sauce. Oyster sauce. Cocktail sauce. Store-bought horseradish. Ketchup. Mustard. Meat flavorings and tenderizers. Bouillon cubes. Hot sauces. Pre-made or packaged marinades. Pre-made or packaged taco seasonings. Relishes. Regular salad dressings. Other foods Salted popcorn and pretzels. The items listed above may not be a complete list of foods and beverages you should avoid. Contact a dietitian for more information. Where to find more information  National Heart, Lung, and Blood Institute: PopSteam.is  American Heart Association: www.heart.org  Academy of Nutrition and Dietetics: www.eatright.org  National Kidney  Foundation: www.kidney.org Summary  The DASH eating plan is a healthy eating plan that has been shown to reduce high blood pressure (hypertension). It may also reduce your risk for type 2 diabetes, heart disease, and stroke.  When on the DASH eating plan, aim to eat more fresh fruits and vegetables, whole grains, lean proteins, low-fat dairy, and heart-healthy fats.  With the DASH eating plan, you should limit salt (sodium) intake to 2,300 mg a day. If you have hypertension, you may need to reduce  your sodium intake to 1,500 mg a day.  Work with your health care provider or dietitian to adjust your eating plan to your individual calorie needs. This information is not intended to replace advice given to you by your health care provider. Make sure you discuss any questions you have with your health care provider. Document Revised: 04/20/2019 Document Reviewed: 04/20/2019 Elsevier Patient Education  2021 [employer].

## 2020-07-30 ENCOUNTER — Other Ambulatory Visit: Payer: Self-pay | Admitting: Family Medicine

## 2020-08-04 ENCOUNTER — Encounter: Payer: Self-pay | Admitting: Family Medicine

## 2020-08-04 ENCOUNTER — Ambulatory Visit: Payer: Self-pay | Admitting: Family Medicine

## 2020-08-04 ENCOUNTER — Other Ambulatory Visit: Payer: Self-pay

## 2020-08-04 VITALS — BP 126/91 | HR 95 | Temp 97.2°F | Wt 207.0 lb

## 2020-08-04 DIAGNOSIS — B379 Candidiasis, unspecified: Secondary | ICD-10-CM

## 2020-08-04 DIAGNOSIS — T3695XA Adverse effect of unspecified systemic antibiotic, initial encounter: Secondary | ICD-10-CM | POA: Insufficient documentation

## 2020-08-04 DIAGNOSIS — H66002 Acute suppurative otitis media without spontaneous rupture of ear drum, left ear: Secondary | ICD-10-CM | POA: Insufficient documentation

## 2020-08-04 MED ORDER — AMOXICILLIN-POT CLAVULANATE 875-125 MG PO TABS: 1.0000 | ORAL_TABLET | Freq: Two times a day (BID) | ORAL | 0 refills | Status: DC

## 2020-08-04 MED ORDER — FLUCONAZOLE 150 MG PO TABS: 150.0000 mg | ORAL_TABLET | Freq: Once | ORAL | 0 refills | Status: AC

## 2020-08-04 NOTE — Patient Instructions (Signed)
Recheck blood pressure at home to ensure it returns to normal. If the tenderness behind left ear does not go away completely- come back.  Use warm compresses to external ear tenderness.     Otitis Media, Adult  Otitis media is a condition in which the middle ear is red and swollen (inflamed) and full of fluid. The middle ear is the part of the ear that contains bones for hearing as well as air that helps send sounds to the brain. The condition usually goes away on its own. What are the causes? This condition is caused by a blockage in the eustachian tube. The eustachian tube connects the middle ear to the back of the nose. It normally allows air into the middle ear. The blockage is caused by fluid or swelling. Problems that can cause blockage include:  A cold or infection that affects the nose, mouth, or throat.  Allergies.  An irritant, such as tobacco smoke.  Adenoids that have become large. The adenoids are soft tissue located in the back of the throat, behind the nose and the roof of the mouth.  Growth or swelling in the upper part of the throat, just behind the nose (nasopharynx).  Damage to the ear caused by change in pressure. This is called barotrauma. What are the signs or symptoms? Symptoms of this condition include:  Ear pain.  Fever.  Problems with hearing.  Being tired.  Fluid leaking from the ear.  Ringing in the ear. How is this treated? This condition can go away on its own within 3-5 days. But if the condition is caused by bacteria or does not go away on its own, or if it keeps coming back, your doctor may:  Give you antibiotic medicines.  Give you medicines for pain. Follow these instructions at home:  Take over-the-counter and prescription medicines only as told by your doctor.  If you were prescribed an antibiotic medicine, take it as told by your doctor. Do not stop taking the antibiotic even if you start to feel better.  Keep all follow-up  visits as told by your doctor. This is important. Contact a doctor if:  You have bleeding from your nose.  There is a lump on your neck.  You are not feeling better in 5 days.  You feel worse instead of better. Get help right away if:  You have pain that is not helped with medicine.  You have swelling, redness, or pain around your ear.  You get a stiff neck.  You cannot move part of your face (paralysis).  You notice that the bone behind your ear hurts when you touch it.  You get a very bad headache. Summary  Otitis media means that the middle ear is red, swollen, and full of fluid.  This condition usually goes away on its own.  If the problem does not go away, treatment may be needed. You may be given medicines to treat the infection or to treat your pain.  If you were prescribed an antibiotic medicine, take it as told by your doctor. Do not stop taking the antibiotic even if you start to feel better.  Keep all follow-up visits as told by your doctor. This is important. This information is not intended to replace advice given to you by your health care provider. Make sure you discuss any questions you have with your health care provider. Document Revised: 04/19/2019 Document Reviewed: 04/19/2019 Elsevier Patient Education  2021 [employer].

## 2020-08-04 NOTE — Progress Notes (Signed)
Pt here for left ear pain. Pt states the outer part of ear is very tender to touch. Pt states that she noticed this about one week ago. Has been taking Zyrtec. Also having sinus pressure.     Patient ID: Zoe Evans, female    DOB: 12-27-1985, 35 y.o.   MRN: [id number]   No chief complaint on file.  Subjective:  CC: left ear pain/external tenderness  This is a new problem.  Presents today for an acute visit with a complaint of left ear pain.  Reports that it is extremely tender to touch on the external portion of the ear, pain is also located behind the ear.  Symptoms have been present for 1 week.  Has a history of allergies, has tried Zyrtec for her allergies.  Also reports that she is having some sinus tenderness.  Denies fever, chills, chest pain, shortness of breath.  Was seen last week for hypertension, blood pressure today in office not optimal, changes made to blood pressure medications last week.    Medical History Zoe Evans has a past medical history of Anxiety, Back pain, Depression, Hearing loss in left ear, HPV (human papilloma virus) anogenital infection, Hypertension, and Vaginal Pap smear, abnormal.   Outpatient Encounter Medications as of 08/04/2020  Medication Sig  . amLODipine (NORVASC) 5 MG tablet Take one tablet by mouth daily  . amoxicillin-clavulanate (AUGMENTIN) 875-125 MG tablet Take 1 tablet by mouth 2 (two) times daily.  . citalopram (CELEXA) 20 MG tablet TAKE 2 TABLETS BY MOUTH IN THE MORNING  . famotidine (PEPCID) 20 MG tablet Take 1 tablet (20 mg total) by mouth daily. 30 mins prior to eating.  . fluconazole (DIFLUCAN) 150 MG tablet Take 1 tablet (150 mg total) by mouth once for 1 dose.  . hydrOXYzine (ATARAX/VISTARIL) 25 MG tablet Take 1 tablet (25 mg total) by mouth 3 (three) times daily as needed for anxiety.  Marland Kitchen losartan (COZAAR) 100 MG tablet Take 1 tablet by mouth once daily  . meloxicam (MOBIC) 15 MG tablet Take 1 tablet (15 mg total) by mouth daily.  .  ondansetron (ZOFRAN ODT) 4 MG disintegrating tablet Take 1 tablet (4 mg total) by mouth every 8 (eight) hours as needed for nausea or vomiting.  . valACYclovir (VALTREX) 1000 MG tablet Take 1 tablet (1,000 mg total) by mouth daily.  . [DISCONTINUED] nitrofurantoin, macrocrystal-monohydrate, (MACROBID) 100 MG capsule Take 1 capsule (100 mg total) by mouth 2 (two) times daily. (Patient not taking: Reported on 07/28/2020)   No facility-administered encounter medications on file as of 08/04/2020.     Review of Systems  Constitutional: Negative for chills and fever.  HENT: Positive for ear pain and sinus pressure. Negative for hearing loss.   Respiratory: Negative for shortness of breath.   Cardiovascular: Negative for chest pain.  Neurological: Negative for headaches.     Vitals BP (!) 126/91   Pulse 95   Temp (!) 97.2 F (36.2 C)   Wt 207 lb (93.9 kg)   SpO2 99%   BMI 36.67 kg/m   Objective:   Physical Exam Vitals reviewed.  Constitutional:      Appearance: Normal appearance.  HENT:     Right Ear: Tympanic membrane normal.     Left Ear: Tenderness present. No drainage. There is mastoid tenderness.     Ears:     Comments: External lobe is tender to touch. No obvious external lesions noted.  Cardiovascular:     Rate and Rhythm: Normal rate and regular  rhythm.     Heart sounds: Normal heart sounds.  Pulmonary:     Effort: Pulmonary effort is normal.     Breath sounds: Normal breath sounds.  Skin:    General: Skin is warm and dry.  Neurological:     General: No focal deficit present.     Mental Status: She is alert.  Psychiatric:        Behavior: Behavior normal.      Assessment and Plan   1. Non-recurrent acute suppurative otitis media of left ear without spontaneous rupture of tympanic membrane - amoxicillin-clavulanate (AUGMENTIN) 875-125 MG tablet; Take 1 tablet by mouth 2 (two) times daily.  Dispense: 20 tablet; Refill: 0  2. Antibiotic-induced yeast  infection - fluconazole (DIFLUCAN) 150 MG tablet; Take 1 tablet (150 mg total) by mouth once for 1 dose.  Dispense: 1 tablet; Refill: 0   Will treat for left ear otitis media with antibiotics for 10 days.  Reports that she gets a yeast infection with antibiotic therapy, prescription sent for Diflucan x1 tablet.  Blood pressure rechecked in office, not better than initial number, she will watch this at home as she has been doing prior to today's visit.  She will let me know if it does not return to a normal blood pressure reading.  Agrees with plan of care discussed today. Understands warning signs to seek further care: chest pain, shortness of breath, any significant change in health.  Understands to follow-up if symptoms do not completely resolve, she already has a blood pressure management follow-up scheduled.   [name], NP 08/04/2020

## 2020-08-05 ENCOUNTER — Telehealth: Payer: Self-pay | Admitting: Family Medicine

## 2020-08-05 NOTE — Telephone Encounter (Signed)
Please give note as requested. Thank you!

## 2020-08-05 NOTE — Telephone Encounter (Signed)
Okay to give work note as requested. KD

## 2020-08-05 NOTE — Telephone Encounter (Signed)
Please advise. Thank you

## 2020-08-05 NOTE — Telephone Encounter (Signed)
Patient is requesting work note for 3/7-3/8 and returning on 3/9.

## 2020-08-07 ENCOUNTER — Telehealth: Payer: Self-pay | Admitting: Family Medicine

## 2020-08-07 ENCOUNTER — Other Ambulatory Visit: Payer: Self-pay | Admitting: Family Medicine

## 2020-08-07 DIAGNOSIS — H66002 Acute suppurative otitis media without spontaneous rupture of ear drum, left ear: Secondary | ICD-10-CM

## 2020-08-07 MED ORDER — CEPHALEXIN 500 MG PO CAPS: 500.0000 mg | ORAL_CAPSULE | Freq: Two times a day (BID) | ORAL | 0 refills | Status: DC

## 2020-08-07 NOTE — Telephone Encounter (Signed)
Patient was seen 3/7 and prescribe augmentin 875-125 mg. She states is having vomiting and upset stomach. Can you call in something else to [facility]

## 2020-08-07 NOTE — Telephone Encounter (Signed)
New antibiotic sent. kd

## 2020-08-07 NOTE — Progress Notes (Signed)
Did not tolerate Augmentin.

## 2020-08-07 NOTE — Telephone Encounter (Signed)
Patient notified

## 2020-08-07 NOTE — Telephone Encounter (Signed)
Please advise. Thank you

## 2020-08-17 ENCOUNTER — Other Ambulatory Visit: Payer: Self-pay | Admitting: Family Medicine

## 2020-08-18 NOTE — Telephone Encounter (Signed)
Lab Results  Component Value Date   HGBA1C 5.3 04/04/2015    Lab Results  Component Value Date   CREATININE 0.77 07/22/2020     Lab Results  Component Value Date   CHOL 184 12/16/2015   HDL 58 12/16/2015   LDLCALC 111 (H) 12/16/2015   TRIG 75 12/16/2015   CHOLHDL 3.2 12/16/2015     BP Readings from Last 3 Encounters:  08/04/20 (!) 126/91  07/28/20 120/90  07/07/20 (!) 132/94

## 2020-08-25 ENCOUNTER — Other Ambulatory Visit: Payer: Self-pay

## 2020-08-25 ENCOUNTER — Ambulatory Visit (INDEPENDENT_AMBULATORY_CARE_PROVIDER_SITE_OTHER): Payer: Self-pay | Admitting: Family Medicine

## 2020-08-25 ENCOUNTER — Ambulatory Visit: Payer: Medicaid Other | Admitting: Family Medicine

## 2020-08-25 ENCOUNTER — Encounter: Payer: Self-pay | Admitting: Family Medicine

## 2020-08-25 VITALS — BP 115/78 | HR 85 | Temp 97.3°F | Wt 212.0 lb

## 2020-08-25 DIAGNOSIS — I10 Essential (primary) hypertension: Secondary | ICD-10-CM

## 2020-08-25 MED ORDER — AMLODIPINE BESYLATE 5 MG PO TABS: ORAL_TABLET | ORAL | 2 refills | Status: DC

## 2020-08-25 MED ORDER — LOSARTAN POTASSIUM 100 MG PO TABS: 100.0000 mg | ORAL_TABLET | Freq: Every day | ORAL | 2 refills | Status: DC

## 2020-08-25 NOTE — Progress Notes (Signed)
Pt here for recheck on blood pressure. Pt states no issues with blood pressure. Pt takes blood pressure when she remembers.     Patient ID: Zoe Evans, female    DOB: September 05, 1985, 35 y.o.   MRN: [id number]   Chief Complaint  Patient presents with  . Hypertension   Subjective:  CC: follow-up for blood pressure  Reports today for follow-up for blood pressure.  Was seen on March 7, diagnosed with otitis media and blood pressure was elevated at that time.  Has been taking blood pressure, blood pressure log, blood pressures have ranged 110-121/64-80.  Blood pressures have been much improved.  Denies fever, chills, chest pain, shortness of breath, vision changes and headaches.  Taking blood pressure medication as prescribed.  Tolerating well with no side effects.    Medical History [name] has a past medical history of Anxiety, Back pain, Depression, Hearing loss in left ear, HPV (human papilloma virus) anogenital infection, Hypertension, and Vaginal Pap smear, abnormal.   Outpatient Encounter Medications as of 08/25/2020  Medication Sig  . citalopram (CELEXA) 20 MG tablet TAKE 2 TABLETS BY MOUTH IN THE MORNING  . famotidine (PEPCID) 20 MG tablet Take 1 tablet (20 mg total) by mouth daily. 30 mins prior to eating.  . hydrOXYzine (ATARAX/VISTARIL) 25 MG tablet Take 1 tablet (25 mg total) by mouth 3 (three) times daily as needed for anxiety.  . meloxicam (MOBIC) 15 MG tablet Take 1 tablet (15 mg total) by mouth daily.  . ondansetron (ZOFRAN ODT) 4 MG disintegrating tablet Take 1 tablet (4 mg total) by mouth every 8 (eight) hours as needed for nausea or vomiting.  . valACYclovir (VALTREX) 1000 MG tablet Take 1 tablet (1,000 mg total) by mouth daily.  . [DISCONTINUED] amLODipine (NORVASC) 5 MG tablet Take one tablet by mouth daily  . [DISCONTINUED] losartan (COZAAR) 100 MG tablet Take 1 tablet by mouth once daily  . amLODipine (NORVASC) 5 MG tablet Take one tablet by mouth daily  .  losartan (COZAAR) 100 MG tablet Take 1 tablet (100 mg total) by mouth daily.  . [DISCONTINUED] cephALEXin (KEFLEX) 500 MG capsule Take 1 capsule (500 mg total) by mouth 2 (two) times daily.   No facility-administered encounter medications on file as of 08/25/2020.     Review of Systems  Constitutional: Negative for chills and fever.  HENT: Negative for ear pain.   Eyes: Negative for visual disturbance.  Respiratory: Negative for shortness of breath.   Cardiovascular: Negative for chest pain.  Gastrointestinal: Negative for abdominal pain.  Neurological: Negative for headaches.     Vitals BP 115/78   Pulse 85   Temp (!) 97.3 F (36.3 C)   Wt 212 lb (96.2 kg)   SpO2 97%   BMI 37.55 kg/m   Objective:   Physical Exam Vitals reviewed.  Constitutional:      Appearance: Normal appearance.  Cardiovascular:     Rate and Rhythm: Normal rate and regular rhythm.     Heart sounds: Normal heart sounds.  Pulmonary:     Effort: Pulmonary effort is normal.     Breath sounds: Normal breath sounds.  Skin:    General: Skin is warm and dry.  Neurological:     General: No focal deficit present.     Mental Status: She is alert.  Psychiatric:        Behavior: Behavior normal.      Assessment and Plan   1. Essential hypertension - amLODipine (NORVASC) 5 MG tablet; Take  one tablet by mouth daily  Dispense: 30 tablet; Refill: 2 - losartan (COZAAR) 100 MG tablet; Take 1 tablet (100 mg total) by mouth daily.  Dispense: 30 tablet; Refill: 2   Impression: Blood pressure well controlled, no side effects.   Hypertension Medication compliance: taking as prescribed.  Denies chest pain, shortness of breath, lower extremity edema, vision changes, headaches.  Pertinent lab work: 2/22: increased potassium foods Monitoring: every 3 months Side effects: none Continue current medication regimen: continue current regimen. Refills sent today. Continue taking blood pressure several times per  week, and let us know if it becomes elevated.   At the end of the visit, reports that she is having issues with her memory.  She will follow up in 2 weeks, with a log with information surrounding times when she cannot remember words of common items. Will preform [place] cognitive assessment at this visit.   Agrees with plan of care discussed today. Understands warning signs to seek further care: chest pain, shortness of breath, any significant change in health.  Understands to follow-up in 2 weeks for memory issues, 3 months for blood pressure.  [name], NP 08/25/2020

## 2020-08-25 NOTE — Patient Instructions (Signed)
DASH Eating Plan DASH stands for Dietary Approaches to Stop Hypertension. The DASH eating plan is a healthy eating plan that has been shown to:  Reduce high blood pressure (hypertension).  Reduce your risk for type 2 diabetes, heart disease, and stroke.  Help with weight loss. What are tips for following this plan? Reading food labels  Check food labels for the amount of salt (sodium) per serving. Choose foods with less than 5 percent of the Daily Value of sodium. Generally, foods with less than 300 milligrams (mg) of sodium per serving fit into this eating plan.  To find whole grains, look for the word "whole" as the first word in the ingredient list. Shopping  Buy products labeled as "low-sodium" or "no salt added."  Buy fresh foods. Avoid canned foods and pre-made or frozen meals. Cooking  Avoid adding salt when cooking. Use salt-free seasonings or herbs instead of table salt or sea salt. Check with your health care provider or pharmacist before using salt substitutes.  Do not fry foods. Cook foods using healthy methods such as baking, boiling, grilling, roasting, and broiling instead.  Cook with heart-healthy oils, such as olive, canola, avocado, soybean, or sunflower oil. Meal planning  Eat a balanced diet that includes: ? 4 or more servings of fruits and 4 or more servings of vegetables each day. Try to fill one-half of your plate with fruits and vegetables. ? 6-8 servings of whole grains each day. ? Less than 6 oz (170 g) of lean meat, poultry, or fish each day. A 3-oz (85-g) serving of meat is about the same size as a deck of cards. One egg equals 1 oz (28 g). ? 2-3 servings of low-fat dairy each day. One serving is 1 cup (237 mL). ? 1 serving of nuts, seeds, or beans 5 times each week. ? 2-3 servings of heart-healthy fats. Healthy fats called omega-3 fatty acids are found in foods such as walnuts, flaxseeds, fortified milks, and eggs. These fats are also found in  cold-water fish, such as sardines, salmon, and mackerel.  Limit how much you eat of: ? Canned or prepackaged foods. ? Food that is high in trans fat, such as some fried foods. ? Food that is high in saturated fat, such as fatty meat. ? Desserts and other sweets, sugary drinks, and other foods with added sugar. ? Full-fat dairy products.  Do not salt foods before eating.  Do not eat more than 4 egg yolks a week.  Try to eat at least 2 vegetarian meals a week.  Eat more home-cooked food and less restaurant, buffet, and fast food.   Lifestyle  When eating at a restaurant, ask that your food be prepared with less salt or no salt, if possible.  If you drink alcohol: ? Limit how much you use to:  0-1 drink a day for women who are not pregnant.  0-2 drinks a day for men. ? Be aware of how much alcohol is in your drink. In the [place] one drink equals one 12 oz bottle of beer (355 mL), one 5 oz glass of wine (148 mL), or one 1 oz glass of hard liquor (44 mL). General information  Avoid eating more than 2,300 mg of salt a day. If you have hypertension, you may need to reduce your sodium intake to 1,500 mg a day.  Work with your health care provider to maintain a healthy body weight or to lose weight. Ask what an ideal weight is for you.    Get at least 30 minutes of exercise that causes your heart to beat faster (aerobic exercise) most days of the week. Activities may include walking, swimming, or biking.  Work with your health care provider or dietitian to adjust your eating plan to your individual calorie needs. What foods should I eat? Fruits All fresh, dried, or frozen fruit. Canned fruit in natural juice (without added sugar). Vegetables Fresh or frozen vegetables (raw, steamed, roasted, or grilled). Low-sodium or reduced-sodium tomato and vegetable juice. Low-sodium or reduced-sodium tomato sauce and tomato paste. Low-sodium or reduced-sodium canned vegetables. Grains Whole-grain  or whole-wheat bread. Whole-grain or whole-wheat pasta. Brown rice. Oatmeal. Quinoa. Bulgur. Whole-grain and low-sodium cereals. Pita bread. Low-fat, low-sodium crackers. Whole-wheat flour tortillas. Meats and other proteins Skinless chicken or turkey. Ground chicken or turkey. Pork with fat trimmed off. Fish and seafood. Egg whites. Dried beans, peas, or lentils. Unsalted nuts, nut butters, and seeds. Unsalted canned beans. Lean cuts of beef with fat trimmed off. Low-sodium, lean precooked or cured meat, such as sausages or meat loaves. Dairy Low-fat (1%) or fat-free (skim) milk. Reduced-fat, low-fat, or fat-free cheeses. Nonfat, low-sodium ricotta or cottage cheese. Low-fat or nonfat yogurt. Low-fat, low-sodium cheese. Fats and oils Soft margarine without trans fats. Vegetable oil. Reduced-fat, low-fat, or light mayonnaise and salad dressings (reduced-sodium). Canola, safflower, olive, avocado, soybean, and sunflower oils. Avocado. Seasonings and condiments Herbs. Spices. Seasoning mixes without salt. Other foods Unsalted popcorn and pretzels. Fat-free sweets. The items listed above may not be a complete list of foods and beverages you can eat. Contact a dietitian for more information. What foods should I avoid? Fruits Canned fruit in a light or heavy syrup. Fried fruit. Fruit in cream or butter sauce. Vegetables Creamed or fried vegetables. Vegetables in a cheese sauce. Regular canned vegetables (not low-sodium or reduced-sodium). Regular canned tomato sauce and paste (not low-sodium or reduced-sodium). Regular tomato and vegetable juice (not low-sodium or reduced-sodium). Pickles. Olives. Grains Baked goods made with fat, such as croissants, muffins, or some breads. Dry pasta or rice meal packs. Meats and other proteins Fatty cuts of meat. Ribs. Fried meat. Bacon. Bologna, salami, and other precooked or cured meats, such as sausages or meat loaves. Fat from the back of a pig (fatback).  Bratwurst. Salted nuts and seeds. Canned beans with added salt. Canned or smoked fish. Whole eggs or egg yolks. Chicken or turkey with skin. Dairy Whole or 2% milk, cream, and half-and-half. Whole or full-fat cream cheese. Whole-fat or sweetened yogurt. Full-fat cheese. Nondairy creamers. Whipped toppings. Processed cheese and cheese spreads. Fats and oils Butter. Stick margarine. Lard. Shortening. Ghee. Bacon fat. Tropical oils, such as coconut, palm kernel, or palm oil. Seasonings and condiments Onion salt, garlic salt, seasoned salt, table salt, and sea salt. Worcestershire sauce. Tartar sauce. Barbecue sauce. Teriyaki sauce. Soy sauce, including reduced-sodium. Steak sauce. Canned and packaged gravies. Fish sauce. Oyster sauce. Cocktail sauce. Store-bought horseradish. Ketchup. Mustard. Meat flavorings and tenderizers. Bouillon cubes. Hot sauces. Pre-made or packaged marinades. Pre-made or packaged taco seasonings. Relishes. Regular salad dressings. Other foods Salted popcorn and pretzels. The items listed above may not be a complete list of foods and beverages you should avoid. Contact a dietitian for more information. Where to find more information  National Heart, Lung, and Blood Institute: www.nhlbi.nih.gov  American Heart Association: www.heart.org  Academy of Nutrition and Dietetics: www.eatright.org  National Kidney Foundation: www.kidney.org Summary  The DASH eating plan is a healthy eating plan that has been shown to reduce high   blood pressure (hypertension). It may also reduce your risk for type 2 diabetes, heart disease, and stroke.  When on the DASH eating plan, aim to eat more fresh fruits and vegetables, whole grains, lean proteins, low-fat dairy, and heart-healthy fats.  With the DASH eating plan, you should limit salt (sodium) intake to 2,300 mg a day. If you have hypertension, you may need to reduce your sodium intake to 1,500 mg a day.  Work with your health care  provider or dietitian to adjust your eating plan to your individual calorie needs. This information is not intended to replace advice given to you by your health care provider. Make sure you discuss any questions you have with your health care provider. Document Revised: 04/20/2019 Document Reviewed: 04/20/2019 Elsevier Patient Education  2021 Elsevier Inc.   

## 2020-09-04 ENCOUNTER — Other Ambulatory Visit: Payer: Self-pay | Admitting: Family Medicine

## 2020-09-05 ENCOUNTER — Other Ambulatory Visit: Payer: Self-pay | Admitting: Family Medicine

## 2020-09-05 NOTE — Telephone Encounter (Signed)
Forward to karen seeing her soon and pt needing refills. Dr. Ladona Ridgel

## 2020-09-08 ENCOUNTER — Encounter: Payer: Medicaid Other | Admitting: Family Medicine

## 2020-09-08 ENCOUNTER — Encounter: Payer: Self-pay | Admitting: Family Medicine

## 2020-09-22 ENCOUNTER — Other Ambulatory Visit: Payer: Self-pay | Admitting: Family Medicine

## 2020-09-22 DIAGNOSIS — I10 Essential (primary) hypertension: Secondary | ICD-10-CM

## 2020-09-24 ENCOUNTER — Other Ambulatory Visit: Payer: Self-pay | Admitting: Family Medicine

## 2020-09-24 DIAGNOSIS — I10 Essential (primary) hypertension: Secondary | ICD-10-CM

## 2020-10-09 ENCOUNTER — Other Ambulatory Visit: Payer: Self-pay | Admitting: Family Medicine

## 2020-10-09 DIAGNOSIS — I10 Essential (primary) hypertension: Secondary | ICD-10-CM

## 2020-10-13 ENCOUNTER — Other Ambulatory Visit: Payer: Self-pay | Admitting: Family Medicine

## 2020-10-13 NOTE — Telephone Encounter (Signed)
Patient is requesting refill on citalopram 20 mg called into [facility]. She states completely out

## 2020-10-14 MED ORDER — CITALOPRAM HYDROBROMIDE 20 MG PO TABS: ORAL_TABLET | ORAL | 0 refills | Status: DC

## 2020-10-30 ENCOUNTER — Telehealth: Payer: Self-pay | Admitting: Family Medicine

## 2020-10-30 NOTE — Telephone Encounter (Signed)
Needs an appt for this. Dr. Ladona Ridgel

## 2020-10-30 NOTE — Telephone Encounter (Signed)
[facility] requesting refill on Valacyclovir 1 gm tablet. Take one tablet po daily. Pt last seen Clydie Braun 08/25/20 for HTN. Please advise. Thank you

## 2020-10-30 NOTE — Telephone Encounter (Signed)
Sent mychart message

## 2020-11-12 ENCOUNTER — Other Ambulatory Visit: Payer: Self-pay | Admitting: Family Medicine

## 2020-11-13 ENCOUNTER — Telehealth: Payer: Self-pay

## 2020-11-13 DIAGNOSIS — F418 Other specified anxiety disorders: Secondary | ICD-10-CM

## 2020-11-13 MED ORDER — CITALOPRAM HYDROBROMIDE 20 MG PO TABS: ORAL_TABLET | ORAL | 0 refills | Status: DC

## 2020-11-13 NOTE — Telephone Encounter (Signed)
Pt is completley our of citalopram (CELEXA) 20 MG tablet  needs sent to [id number] - , [place] - [address] Med Check apt made end of the month   Pt call back [phone]

## 2020-11-26 ENCOUNTER — Ambulatory Visit (INDEPENDENT_AMBULATORY_CARE_PROVIDER_SITE_OTHER): Payer: Medicaid Other | Admitting: Family Medicine

## 2020-11-26 ENCOUNTER — Encounter: Payer: Self-pay | Admitting: Family Medicine

## 2020-11-26 ENCOUNTER — Other Ambulatory Visit: Payer: Self-pay

## 2020-11-26 VITALS — BP 116/78 | HR 106 | Temp 98.0°F | Ht 63.0 in | Wt 210.0 lb

## 2020-11-26 DIAGNOSIS — R109 Unspecified abdominal pain: Secondary | ICD-10-CM

## 2020-11-26 DIAGNOSIS — F418 Other specified anxiety disorders: Secondary | ICD-10-CM

## 2020-11-26 DIAGNOSIS — K219 Gastro-esophageal reflux disease without esophagitis: Secondary | ICD-10-CM

## 2020-11-26 DIAGNOSIS — I10 Essential (primary) hypertension: Secondary | ICD-10-CM

## 2020-11-26 MED ORDER — FAMOTIDINE 20 MG PO TABS: 20.0000 mg | ORAL_TABLET | Freq: Every day | ORAL | 5 refills | Status: DC

## 2020-11-26 MED ORDER — VENLAFAXINE HCL ER 37.5 MG PO CP24: 37.5000 mg | ORAL_CAPSULE | Freq: Every day | ORAL | 1 refills | Status: DC

## 2020-11-26 MED ORDER — AMLODIPINE BESYLATE 5 MG PO TABS: ORAL_TABLET | ORAL | 5 refills | Status: DC

## 2020-11-26 MED ORDER — LOSARTAN POTASSIUM 100 MG PO TABS: 100.0000 mg | ORAL_TABLET | Freq: Every day | ORAL | 5 refills | Status: DC

## 2020-11-26 MED ORDER — VALACYCLOVIR HCL 1 G PO TABS: 1000.0000 mg | ORAL_TABLET | Freq: Every day | ORAL | 5 refills | Status: DC

## 2020-11-26 NOTE — Progress Notes (Signed)
Patient ID: Zoe Evans, female    DOB: 1986-01-02, 35 y.o.   MRN: [id number]   Chief Complaint  Patient presents with   Depression   Anxiety    Subjective:    HPI Med check up for depression/anxiety.  Phq9 and gad 7 done and reviewed.  Has been on celexa for years 40mg  daily. Has been on it for years.  Was on wellbutrin and then had eye lid swelling and rash on face.    Tired and overeating to try to make herself feel better.  Feeling crying a lot.  Sharp pain in rt neck and massage and getting headaches from the pain. Working day shifts. Falling asleep easily. When get upset and then feeling a burning through arms and chest discomfort. Sleep- snoring. Not having any apnea. Waking up sweating a lot. Taking medications at night. Having some nightmares. Strange dreams.  About 1 mo ago was out of it for 2 wks and criying and anxiety. Had some dizziness.  Gerd- stable. Doing well.  HTN Pt compliant with BP meds.  No SEs Denies chest pain, sob, LE swelling, or blurry vision.   Medical History Aubreyana has a past medical history of Anxiety, Back pain, Depression, Hearing loss in left ear, HPV (human papilloma virus) anogenital infection, Hypertension, and Vaginal Pap smear, abnormal.   Outpatient Encounter Medications as of 11/26/2020  Medication Sig   citalopram (CELEXA) 20 MG tablet TAKE 2 TABLETS BY MOUTH IN THE MORNING   hydrOXYzine (ATARAX/VISTARIL) 25 MG tablet Take 1 tablet (25 mg total) by mouth 3 (three) times daily as needed for anxiety.   ondansetron (ZOFRAN ODT) 4 MG disintegrating tablet Take 1 tablet (4 mg total) by mouth every 8 (eight) hours as needed for nausea or vomiting.   venlafaxine XR (EFFEXOR XR) 37.5 MG 24 hr capsule Take 1 capsule (37.5 mg total) by mouth daily with breakfast.   [DISCONTINUED] amLODipine (NORVASC) 5 MG tablet Take 1 tablet by mouth once daily   [DISCONTINUED] citalopram (CELEXA) 20 MG tablet TAKE 2 TABLETS BY MOUTH IN  THE MORNING   [DISCONTINUED] famotidine (PEPCID) 20 MG tablet Take 1 tablet (20 mg total) by mouth daily. 30 mins prior to eating.   [DISCONTINUED] losartan (COZAAR) 100 MG tablet Take 1 tablet by mouth once daily   [DISCONTINUED] valACYclovir (VALTREX) 1000 MG tablet Take 1 tablet (1,000 mg total) by mouth daily.   amLODipine (NORVASC) 5 MG tablet Take 1 tablet by mouth once daily   famotidine (PEPCID) 20 MG tablet Take 1 tablet (20 mg total) by mouth daily. 30 mins prior to eating.   losartan (COZAAR) 100 MG tablet Take 1 tablet (100 mg total) by mouth daily.   valACYclovir (VALTREX) 1000 MG tablet Take 1 tablet (1,000 mg total) by mouth daily.   No facility-administered encounter medications on file as of 11/26/2020.     Review of Systems  Constitutional:  Negative for chills and fever.  HENT:  Negative for congestion, rhinorrhea and sore throat.   Respiratory:  Negative for cough, shortness of breath and wheezing.   Cardiovascular:  Negative for chest pain and leg swelling.  Gastrointestinal:  Positive for abdominal pain (intermittent over past year). Negative for diarrhea, nausea and vomiting.  Genitourinary:  Negative for dysuria and frequency.  Musculoskeletal:  Negative for arthralgias and back pain.  Skin:  Negative for rash.  Neurological:  Negative for dizziness, weakness and headaches.  Psychiatric/Behavioral:  Positive for dysphoric mood and sleep disturbance. Negative for agitation,  self-injury and suicidal ideas. The patient is nervous/anxious.     Vitals BP 116/78   Pulse (!) 106   Temp 98 F (36.7 C)   Ht 5\' 3"  (1.6 m)   Wt 210 lb (95.3 kg)   SpO2 99%   BMI 37.20 kg/m   Objective:   Physical Exam Vitals and nursing note reviewed.  Constitutional:      Appearance: Normal appearance.  HENT:     Head: Normocephalic and atraumatic.     Nose: Nose normal.     Mouth/Throat:     Mouth: Mucous membranes are moist.     Pharynx: Oropharynx is clear.  Eyes:      Extraocular Movements: Extraocular movements intact.     Conjunctiva/sclera: Conjunctivae normal.     Pupils: Pupils are equal, round, and reactive to light.  Cardiovascular:     Rate and Rhythm: Normal rate and regular rhythm.     Pulses: Normal pulses.     Heart sounds: Normal heart sounds.  Pulmonary:     Effort: Pulmonary effort is normal.     Breath sounds: Normal breath sounds. No wheezing, rhonchi or rales.  Musculoskeletal:        General: Normal range of motion.     Right lower leg: No edema.     Left lower leg: No edema.  Skin:    General: Skin is warm and dry.     Findings: No lesion or rash.  Neurological:     General: No focal deficit present.     Mental Status: She is alert and oriented to person, place, and time.  Psychiatric:        Mood and Affect: Mood normal.        Behavior: Behavior normal.     Assessment and Plan   1. Depression with anxiety  2. Gastroesophageal reflux disease, unspecified whether esophagitis present - famotidine (PEPCID) 20 MG tablet; Take 1 tablet (20 mg total) by mouth daily. 30 mins prior to eating.  Dispense: 30 tablet; Refill: 5  3. Essential hypertension - amLODipine (NORVASC) 5 MG tablet; Take 1 tablet by mouth once daily  Dispense: 30 tablet; Refill: 5 - losartan (COZAAR) 100 MG tablet; Take 1 tablet (100 mg total) by mouth daily.  Dispense: 30 tablet; Refill: 5  4. Intermittent abdominal pain   F/u anxiety/depression in 4 wks.  Taper off celexa and increase on effx  Gerd- cont with meds  Intermittent abd pain on rt side, intermittent diarrhea/constipation- pt to f/u with GI. Pt stating has insurance issue and not able to see them, 1 yr ago had CT abd, showing some colitis inflammatory vs. Infectious at that time. Diarrhea is intermittent and only 1-2x per day a few times per week. No acute issue today with abd.  Return in about 4 weeks (around 12/24/2020) for f/u anxiety.   11/26/2020

## 2020-11-26 NOTE — Patient Instructions (Signed)
For this week- Take 20mg  celexa daily for 5 days.  Then take 37.5mg  effexor and 10mg  celexa. Continue with 10mg  celexa at night for 5 days then discontinue celexa.  Continue with daily 37.5mg  effexor.

## 2020-12-17 ENCOUNTER — Other Ambulatory Visit: Payer: Self-pay

## 2020-12-17 ENCOUNTER — Ambulatory Visit
Admission: EM | Admit: 2020-12-17 | Discharge: 2020-12-17 | Disposition: A | Discharge status: Home / Self Care | Payer: Medicaid Other | Attending: Family Medicine | Admitting: Family Medicine

## 2020-12-17 ENCOUNTER — Encounter: Payer: Self-pay | Admitting: Emergency Medicine

## 2020-12-17 DIAGNOSIS — S0006XA Insect bite (nonvenomous) of scalp, initial encounter: Secondary | ICD-10-CM

## 2020-12-17 DIAGNOSIS — R590 Localized enlarged lymph nodes: Secondary | ICD-10-CM

## 2020-12-17 DIAGNOSIS — M542 Cervicalgia: Secondary | ICD-10-CM

## 2020-12-17 MED ORDER — DOXYCYCLINE HYCLATE 100 MG PO CAPS: 100.0000 mg | ORAL_CAPSULE | Freq: Two times a day (BID) | ORAL | 0 refills | Status: DC

## 2020-12-17 NOTE — ED Triage Notes (Signed)
Swollen lymph node at hair line on left side of neck.  States pain is radiating down to arm.  States area burns and is sore to the touch x 4 days.

## 2020-12-17 NOTE — ED Provider Notes (Signed)
[facility]   [id number] 12/17/20 Arrival Time: 0906  ASSESSMENT & PLAN:  1. Neck pain on left side   2. Enlarged lymph node in neck   3. Tick bite of scalp, initial encounter    No sign of skin infection. Given engorged tick and enlarged lymph node, she would like treatment. Begin: Meds ordered this encounter  Medications   doxycycline (VIBRAMYCIN) 100 MG capsule    Sig: Take 1 capsule (100 mg total) by mouth 2 (two) times daily.    Dispense:  20 capsule    Refill:  0    Follow-up Information     Ladona Ridgel, Malena M, DO.   Specialty: Family Medicine Why: If worsening or failing to improve as anticipated. Contact information: [address] [place] [address] [phone]                 Reviewed expectations re: course of current medical issues. Questions answered. Outlined signs and symptoms indicating need for more acute intervention. Patient verbalized understanding. After Visit Summary given.   SUBJECTIVE: History from: patient. Zoe Evans is a 35 y.o. female who reports finding and removing a engorged tick from her upper scalp  4-5 d ago . Feeling well overall; some fatigue. Has noticed an enlarged lymph node on L post neck; tender. Afebrile. No rashes. No headaches, n/v, visual changes, extremity edema reported. Ambulatory without difficulty. No OTC treatment.   OBJECTIVE:  Vitals:   12/17/20 1000  BP: 126/87  Pulse: 85  Resp: 16  Temp: (!) 97.5 F (36.4 C)  TempSrc: Oral  SpO2: 98%    General appearance: alert; no distress Eyes: PERRLA; EOMI; conjunctiva normal Neck: solitary enlarged lymph node of L post neck; is TTP; no overlying redness Lungs: unlabored Extremities: no edema; symmetrical with no gross deformities Skin: warm and dry; no infection of scalp noted Neurologic: normal gait; normal symmetric reflexes Psychological: alert and cooperative; normal mood and affect  Allergies  Allergen Reactions   Augmentin  [Amoxicillin-Pot Clavulanate] Swelling    Swelling, vomiting, nausea, rash   Ace Inhibitors Cough   Buspar [Buspirone]     Rash arms and face.   Codeine Nausea And Vomiting and Other (See Comments)    "liquid codeine"    Past Medical History:  Diagnosis Date   Anxiety    Back pain    Depression    Hearing loss in left ear    HPV (human papilloma virus) anogenital infection    Hypertension    Vaginal Pap smear, abnormal    Social History   Socioeconomic History   Marital status: Married    Spouse name: Not on file   Number of children: Not on file   Years of education: Not on file   Highest education level: Not on file  Occupational History   Not on file  Tobacco Use   Smoking status: Never   Smokeless tobacco: Never  Vaping Use   Vaping Use: Never used  Substance and Sexual Activity   Alcohol use: Not Currently    Comment: occasional   Drug use: Never   Sexual activity: Yes    Birth control/protection: I.U.D.  Other Topics Concern   Not on file  Social History Narrative   ** Merged History Encounter **       Social Determinants of Health   Financial Resource Strain: Not on file  Food Insecurity: Not on file  Transportation Needs: Not on file  Physical Activity: Not on file  Stress:  Not on file  Social Connections: Not on file  Intimate Partner Violence: Not on file   Family History  Problem Relation Age of Onset   Hypertension Mother    Cancer Maternal Aunt    Cancer Maternal Grandmother    Cancer Paternal Grandmother    Breast cancer Maternal Aunt 30       3 maternal cousins as well   Breast cancer Maternal Grandmother 15   Breast cancer Cousin    Breast cancer Cousin    Breast cancer Cousin    Past Surgical History:  Procedure Laterality Date   CESAREAN SECTION     CESAREAN SECTION     CESAREAN SECTION N/A 02/28/2018   Procedure: CESAREAN SECTION;  Surgeon: Levi Aland, MD;  Location: [facility];  Service: Obstetrics;   Laterality: N/A;  Heather RNFA - MD declined RNFA, Henderson Cloud to assist      Mardella Layman, MD 12/17/20 1300

## 2020-12-23 ENCOUNTER — Other Ambulatory Visit: Payer: Self-pay | Admitting: Family Medicine

## 2020-12-23 DIAGNOSIS — I10 Essential (primary) hypertension: Secondary | ICD-10-CM

## 2020-12-24 ENCOUNTER — Other Ambulatory Visit: Payer: Self-pay

## 2020-12-24 ENCOUNTER — Telehealth (INDEPENDENT_AMBULATORY_CARE_PROVIDER_SITE_OTHER): Payer: Medicaid Other | Admitting: Family Medicine

## 2020-12-24 DIAGNOSIS — F418 Other specified anxiety disorders: Secondary | ICD-10-CM

## 2020-12-24 MED ORDER — VENLAFAXINE HCL ER 75 MG PO CP24: 75.0000 mg | ORAL_CAPSULE | Freq: Every day | ORAL | 0 refills | Status: DC

## 2020-12-24 NOTE — Progress Notes (Signed)
Patient ID: Zoe Evans, female    DOB: 1986-03-09, 35 y.o.   MRN: [id number] I connected with  Zoe Evans on 12/24/20 by a video enabled telemedicine application and verified that I am speaking with the correct person using two identifiers.   I discussed the limitations of evaluation and management by telemedicine. The patient expressed understanding and agreed to proceed.  Patient location: home  Provider location: in office  I provided 12 minutes of non face - to - face time during this encounter.   Chief Complaint  Patient presents with   Anxiety    Depression Gad 7 - 19 Phq9- 17   Subjective:    HPI Patient stating she has not been improving on her anxiety and depression.  She was switched from Celexa to Effexor.  She just feels her anxiety is more out of control.  Using hydroxyzine prn for anxiety but making her over sedated.  Not as bad as she felt when on celexa. Pt stating has been on Celexa since 35 yrs old. Not feeling it was helping at all.  Patient tried BuSpar and had an allergic reaction, rash and it was discontinued. Not feeling suicidal or having homicidal thoughts.  No concerns of self-harm.  Patient denies any chest pain, palpitations, panic attacks.  Medical History Zoe Evans has a past medical history of Anxiety, Back pain, Depression, Hearing loss in left ear, HPV (human papilloma virus) anogenital infection, Hypertension, and Vaginal Pap smear, abnormal.   Outpatient Encounter Medications as of 12/24/2020  Medication Sig   amLODipine (NORVASC) 5 MG tablet Take 1 tablet by mouth once daily   doxycycline (VIBRAMYCIN) 100 MG capsule Take 1 capsule (100 mg total) by mouth 2 (two) times daily.   famotidine (PEPCID) 20 MG tablet Take 1 tablet (20 mg total) by mouth daily. 30 mins prior to eating.   hydrOXYzine (ATARAX/VISTARIL) 25 MG tablet Take 1 tablet (25 mg total) by mouth 3 (three) times daily as needed for anxiety.   losartan (COZAAR) 100 MG  tablet Take 1 tablet by mouth once daily   ondansetron (ZOFRAN ODT) 4 MG disintegrating tablet Take 1 tablet (4 mg total) by mouth every 8 (eight) hours as needed for nausea or vomiting.   valACYclovir (VALTREX) 1000 MG tablet Take 1 tablet (1,000 mg total) by mouth daily.   venlafaxine XR (EFFEXOR-XR) 75 MG 24 hr capsule Take 1 capsule (75 mg total) by mouth daily with breakfast.   [DISCONTINUED] venlafaxine XR (EFFEXOR XR) 37.5 MG 24 hr capsule Take 1 capsule (37.5 mg total) by mouth daily with breakfast.   [DISCONTINUED] citalopram (CELEXA) 20 MG tablet TAKE 2 TABLETS BY MOUTH IN THE MORNING   [DISCONTINUED] losartan (COZAAR) 100 MG tablet Take 1 tablet (100 mg total) by mouth daily.   No facility-administered encounter medications on file as of 12/24/2020.     Review of Systems  Constitutional:  Negative for chills and fever.  HENT:  Negative for congestion, rhinorrhea and sore throat.   Respiratory:  Negative for cough, shortness of breath and wheezing.   Cardiovascular:  Negative for chest pain and leg swelling.  Gastrointestinal:  Negative for abdominal pain, diarrhea, nausea and vomiting.  Genitourinary:  Negative for dysuria and frequency.  Musculoskeletal:  Negative for arthralgias and back pain.  Skin:  Negative for rash.  Neurological:  Negative for dizziness, weakness and headaches.  Psychiatric/Behavioral:  Positive for dysphoric mood. Negative for self-injury, sleep disturbance and suicidal ideas. The patient is nervous/anxious. The patient  is not hyperactive.     Vitals LMP 12/11/2020 (Exact Date)   Objective:   Physical Exam No PE due to phone visit.  Assessment and Plan   1. Depression with anxiety - venlafaxine XR (EFFEXOR-XR) 75 MG 24 hr capsule; Take 1 capsule (75 mg total) by mouth daily with breakfast.  Dispense: 30 capsule; Refill: 0 - Ambulatory referral to Psychology   Depression and anxiety-not controlled. Would increase the effexor to 75mg  from  37.5mg  xr. And refer to psychology to help with the coping with the anxiety and depression.  Patient to call or return the office if symptoms are worsening. Pt in agreement.   Return in about 24 days (around 01/17/2021) for f/u anxiety.

## 2021-01-06 ENCOUNTER — Other Ambulatory Visit: Payer: Self-pay | Admitting: Family Medicine

## 2021-01-06 DIAGNOSIS — I10 Essential (primary) hypertension: Secondary | ICD-10-CM

## 2021-01-15 ENCOUNTER — Other Ambulatory Visit: Payer: Self-pay

## 2021-01-15 ENCOUNTER — Telehealth: Payer: Self-pay | Admitting: Family Medicine

## 2021-01-15 ENCOUNTER — Telehealth (INDEPENDENT_AMBULATORY_CARE_PROVIDER_SITE_OTHER): Payer: Self-pay | Admitting: Family Medicine

## 2021-01-15 DIAGNOSIS — Z8619 Personal history of other infectious and parasitic diseases: Secondary | ICD-10-CM | POA: Insufficient documentation

## 2021-01-15 DIAGNOSIS — F418 Other specified anxiety disorders: Secondary | ICD-10-CM

## 2021-01-15 MED ORDER — HYDROXYZINE HCL 25 MG PO TABS: 25.0000 mg | ORAL_TABLET | Freq: Three times a day (TID) | ORAL | 0 refills | Status: DC | PRN

## 2021-01-15 MED ORDER — VENLAFAXINE HCL ER 75 MG PO CP24: 75.0000 mg | ORAL_CAPSULE | Freq: Every day | ORAL | 3 refills | Status: DC

## 2021-01-15 NOTE — Telephone Encounter (Signed)
Ms. talene, glastetter are scheduled for a virtual visit with your provider today.    Just as we do with appointments in the office, we must obtain your consent to participate.  Your consent will be active for this visit and any virtual visit you may have with one of our providers in the next 365 days.    If you have a MyChart account, I can also send a copy of this consent to you electronically.  All virtual visits are billed to your insurance company just like a traditional visit in the office.  As this is a virtual visit, video technology does not allow for your provider to perform a traditional examination.  This may limit your provider's ability to fully assess your condition.  If your provider identifies any concerns that need to be evaluated in person or the need to arrange testing such as labs, EKG, etc, we will make arrangements to do so.    Although advances in technology are sophisticated, we cannot ensure that it will always work on either your end or our end.  If the connection with a video visit is poor, we may have to switch to a telephone visit.  With either a video or telephone visit, we are not always able to ensure that we have a secure connection.   I need to obtain your verbal consent now.   Are you willing to proceed with your visit today?   AUDRYANNA ZURITA has provided verbal consent on 01/15/2021 for a virtual visit (video or telephone).   Marlowe Shores, LPN 08/27/5186  4:16 AM

## 2021-01-15 NOTE — Progress Notes (Signed)
Patient ID: Zoe Evans, female    DOB: 05-May-1986, 35 y.o.   MRN: [id number]   Virtual Visit via Telephone Note  I connected with Zoe Evans on 01/15/21 at  9:40 AM EDT by telephone and verified that I am speaking with the correct person using two identifiers.  Location: Patient: home Provider: office   I discussed the limitations, risks, security and privacy concerns of performing an evaluation and management service by telephone and the availability of in person appointments. I also discussed with the patient that there may be a patient responsible charge related to this service. The patient expressed understanding and agreed to proceed. Chief Complaint  Patient presents with   Anxiety   Subjective:    HPI Pt following up on Anxiety.  We had changed pt from celexa 40mg , which she had been on for years to effexor 36.5mg .  last visit wasn't feeling depression was controlled.   We increased it to 75mg  effexor xr.  Had tried buspar for anxiety- but gave a rash and discontinued.  Pt states the the Effexor gives her a headache. Pt states her depression is better but anxiety can increase at times. Pt takes Hydroxyzine prn anxiety.  Pt would like refills on Valtrex.  Depression is a lot better.  Getting some bouts of anxiety but more manageable.  Taking the hydroxyzine prn.  No sleep issues.   Pt has h/o genital herpes on buttock- not taking valtrex daily.  Getting a flare up 1 x every 4 months.  Taking it until it resolves, in a few days.  Has refills on it.  Medical History [name] has a past medical history of Anxiety, Back pain, Depression, Hearing loss in left ear, HPV (human papilloma virus) anogenital infection, Hypertension, and Vaginal Pap smear, abnormal.   Outpatient Encounter Medications as of 01/15/2021  Medication Sig   amLODipine (NORVASC) 5 MG tablet Take 1 tablet by mouth once daily   famotidine (PEPCID) 20 MG tablet Take 1 tablet (20 mg  total) by mouth daily. 30 mins prior to eating.   losartan (COZAAR) 100 MG tablet Take 1 tablet by mouth once daily   ondansetron (ZOFRAN ODT) 4 MG disintegrating tablet Take 1 tablet (4 mg total) by mouth every 8 (eight) hours as needed for nausea or vomiting.   valACYclovir (VALTREX) 1000 MG tablet Take 1 tablet (1,000 mg total) by mouth daily.   [DISCONTINUED] hydrOXYzine (ATARAX/VISTARIL) 25 MG tablet TAKE 1 TABLET BY MOUTH THREE TIMES DAILY AS NEEDED FOR ANXIETY   [DISCONTINUED] venlafaxine XR (EFFEXOR-XR) 75 MG 24 hr capsule Take 1 capsule (75 mg total) by mouth daily with breakfast.   hydrOXYzine (ATARAX/VISTARIL) 25 MG tablet Take 1 tablet (25 mg total) by mouth 3 (three) times daily as needed. for anxiety   venlafaxine XR (EFFEXOR-XR) 75 MG 24 hr capsule Take 1 capsule (75 mg total) by mouth daily with breakfast.   [DISCONTINUED] doxycycline (VIBRAMYCIN) 100 MG capsule Take 1 capsule (100 mg total) by mouth 2 (two) times daily.   No facility-administered encounter medications on file as of 01/15/2021.     Review of Systems  Constitutional:  Negative for chills and fever.  HENT:  Negative for congestion, rhinorrhea and sore throat.   Respiratory:  Negative for cough, shortness of breath and wheezing.   Cardiovascular:  Negative for chest pain and leg swelling.  Gastrointestinal:  Negative for abdominal pain, diarrhea, nausea and vomiting.  Genitourinary:  Negative for dysuria and frequency.  Musculoskeletal:  Negative for arthralgias and back pain.  Skin:  Negative for rash.  Neurological:  Positive for headaches. Negative for dizziness and weakness.    Vitals There were no vitals taken for this visit.  Objective:   Physical Exam No PE due to phone visit.  Assessment and Plan   1. Depression with anxiety - venlafaxine XR (EFFEXOR-XR) 75 MG 24 hr capsule; Take 1 capsule (75 mg total) by mouth daily with breakfast.  Dispense: 30 capsule; Refill: 3 - hydrOXYzine  (ATARAX/VISTARIL) 25 MG tablet; Take 1 tablet (25 mg total) by mouth 3 (three) times daily as needed. for anxiety  Dispense: 45 tablet; Refill: 0  2. H/O herpes genitalis   Pt stable, cont effexor and hydroxyzine. F/u 50mo or prn.  Pt requesting refill on valtrex for genital herpes. Stable. No flares currently.   Return in about 6 months (around 07/18/2021), or if symptoms worsen or fail to improve.     Follow Up Instructions:    I discussed the assessment and treatment plan with the patient. The patient was provided an opportunity to ask questions and all were answered. The patient agreed with the plan and demonstrated an understanding of the instructions.   The patient was advised to call back or seek an in-person evaluation if the symptoms worsen or if the condition fails to improve as anticipated.  I provided 15 minutes of non-face-to-face time during this encounter.

## 2021-01-21 IMAGING — CT CT ABD-PELV W/ CM
2 of 4 series · 16 of 46 positions shown, 18 images · IV contrast (Omnipaque or Isovue)
Comparison: None.

CLINICAL DATA: Right lower quadrant abdominal pain, nausea/vomiting

EXAM:
CT ABDOMEN AND PELVIS WITH CONTRAST
TECHNIQUE: Multidetector CT imaging of the abdomen and pelvis was performed
using the standard protocol following bolus administration of
intravenous contrast.
CONTRAST:  100mL OMNIPAQUE IOHEXOL 300 MG/ML  SOLN

[Series 2: axial st · axial · 0.77mm/px · z∈[+593,+1063]mm · 13 of 104 slices shown, 15 images]
[im 5/104  soft-tissue]
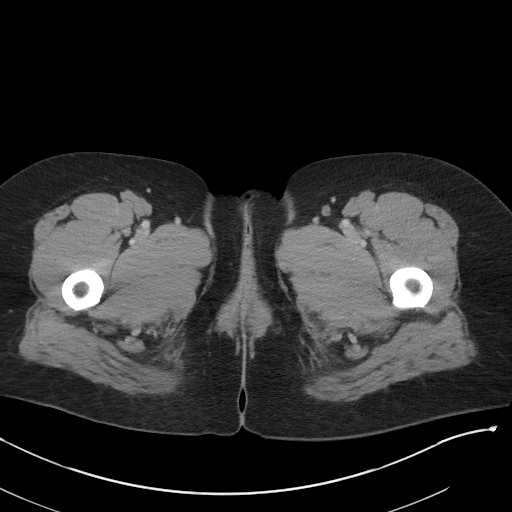
[im 5/104  bone]
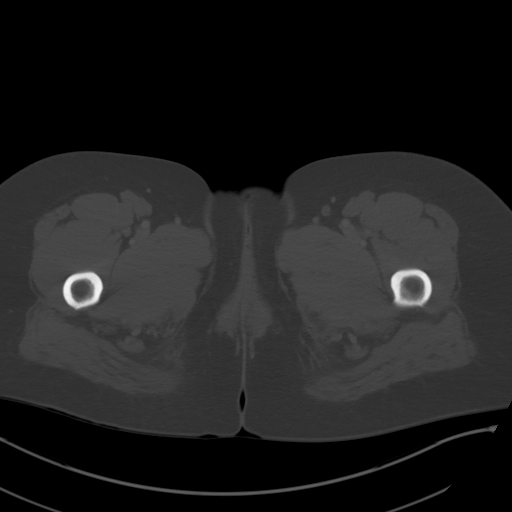
[im 13/104  soft-tissue]
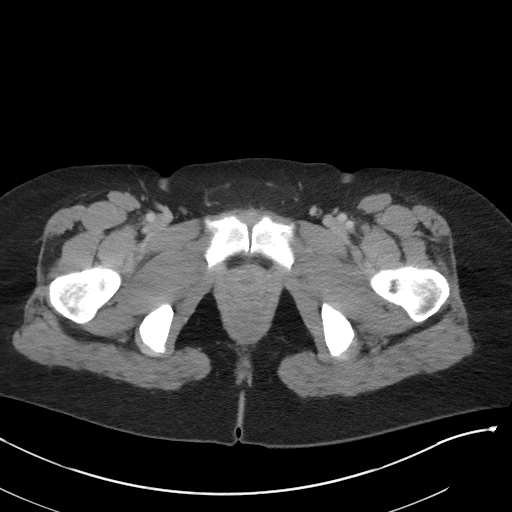
[im 22/104  soft-tissue]
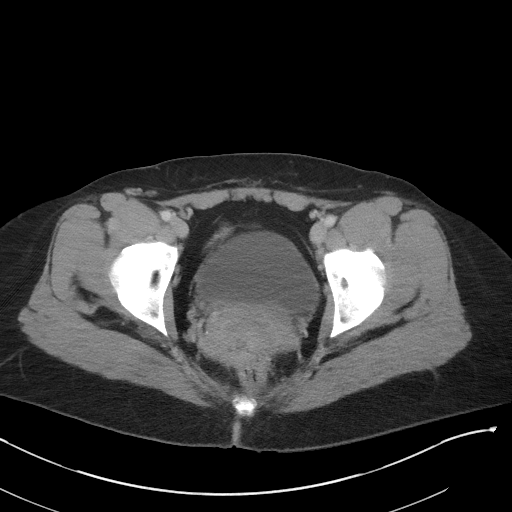
[im 31/104  soft-tissue]
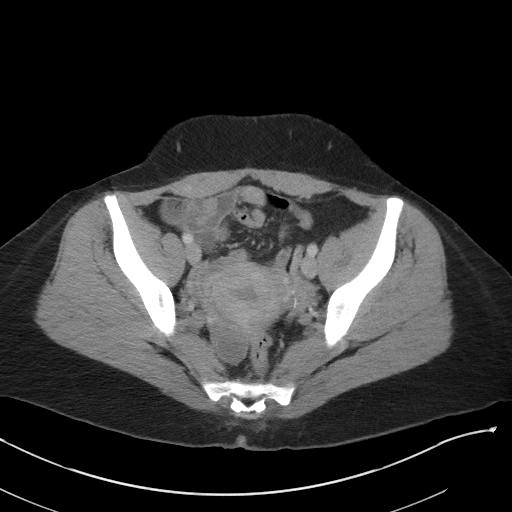
[im 35/104  soft-tissue]
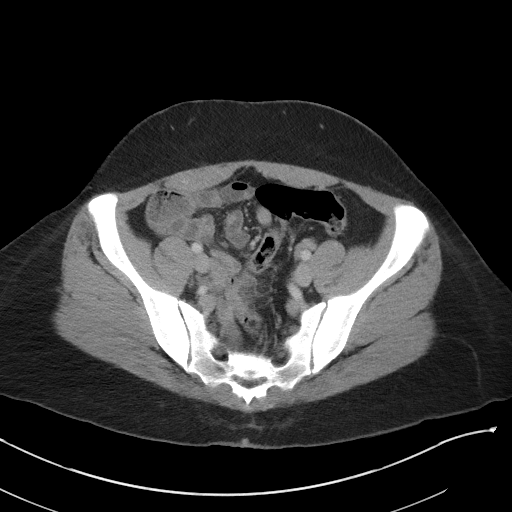
[im 43/104  soft-tissue]
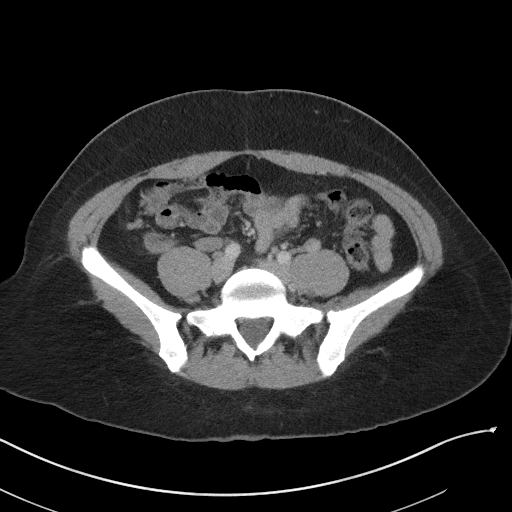
[im 52/104  soft-tissue]
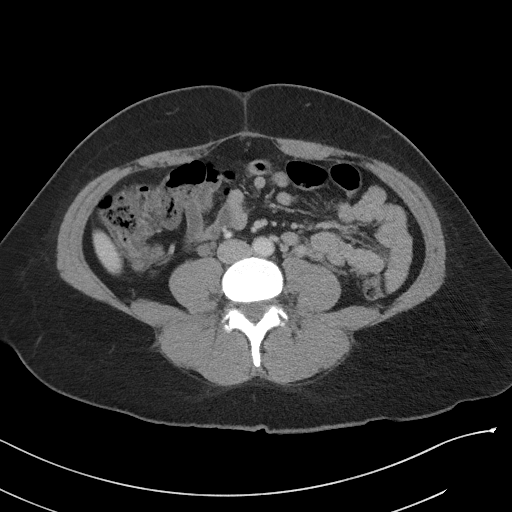
[im 61/104  soft-tissue]
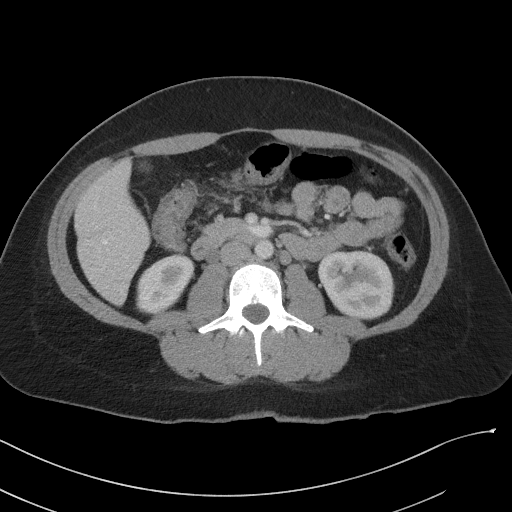
[im 69/104  soft-tissue]
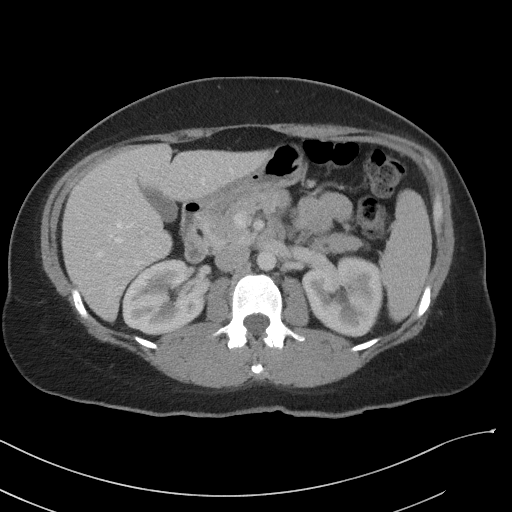
[im 69/104  bone]
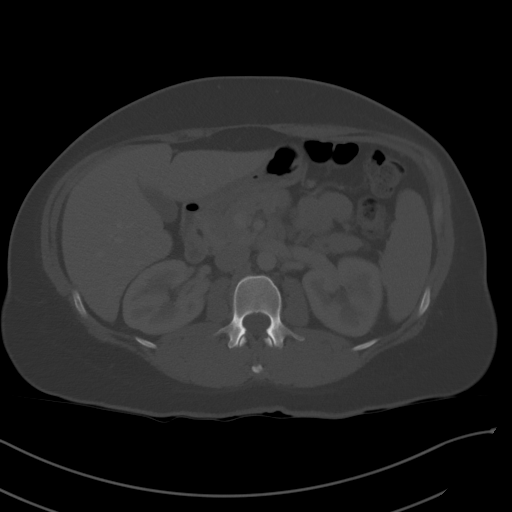
[im 73/104  soft-tissue]
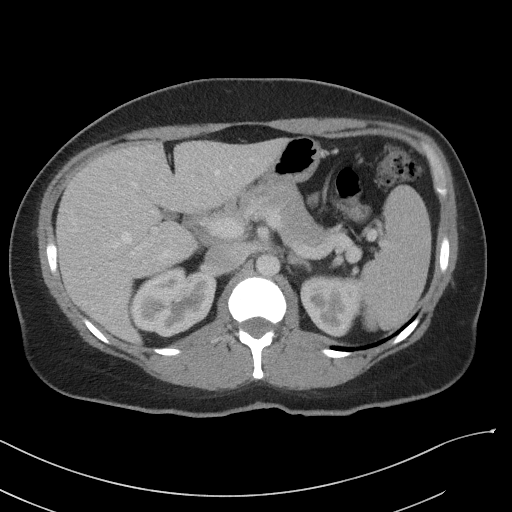
[im 82/104  soft-tissue]
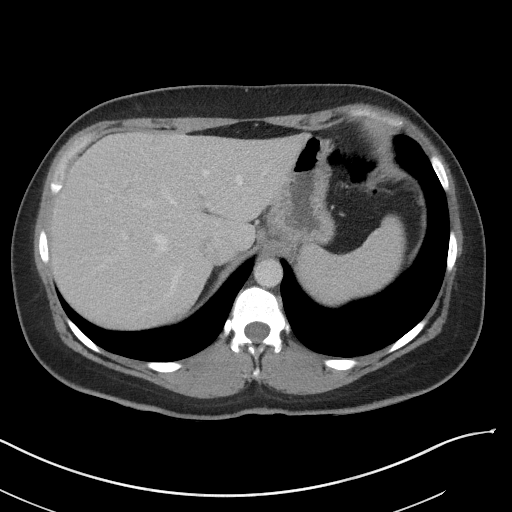
[im 91/104  soft-tissue]
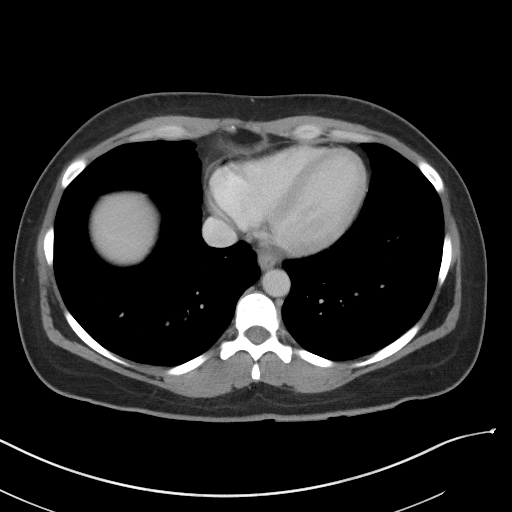
[im 99/104  soft-tissue]
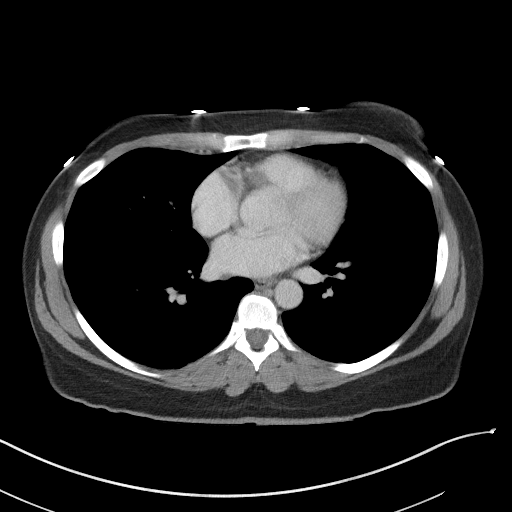

[Series 5: coronal st · coronal · 0.76mm/px · 3 of 92 slices shown]
[im 31/92  soft-tissue]
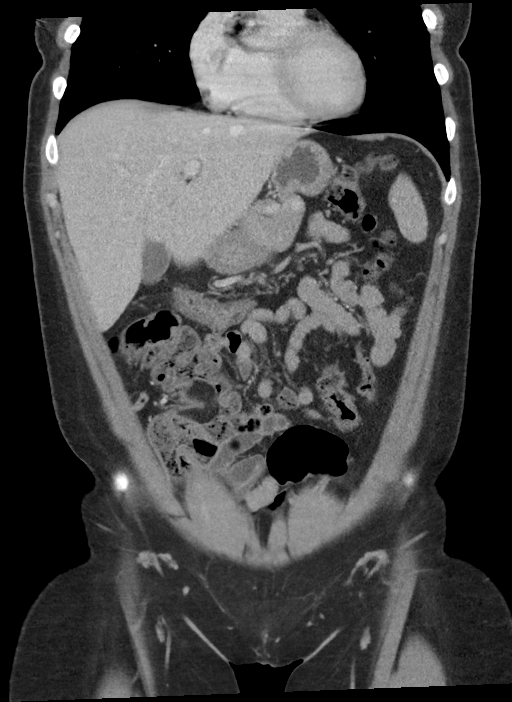
[im 41/92  soft-tissue]
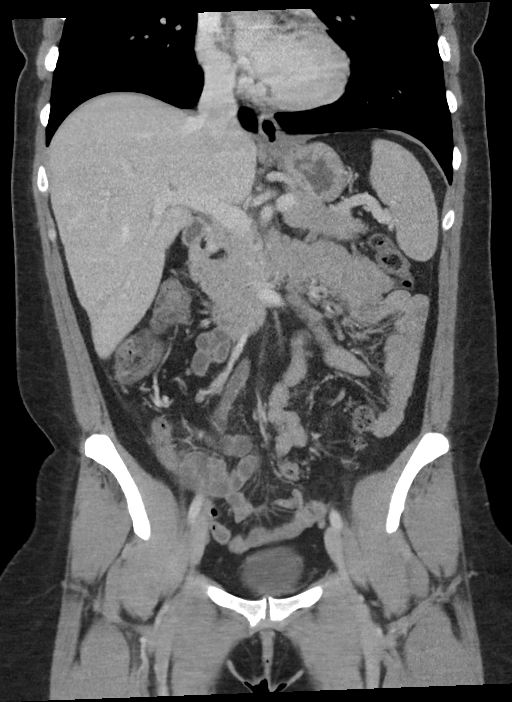
[im 51/92  soft-tissue]
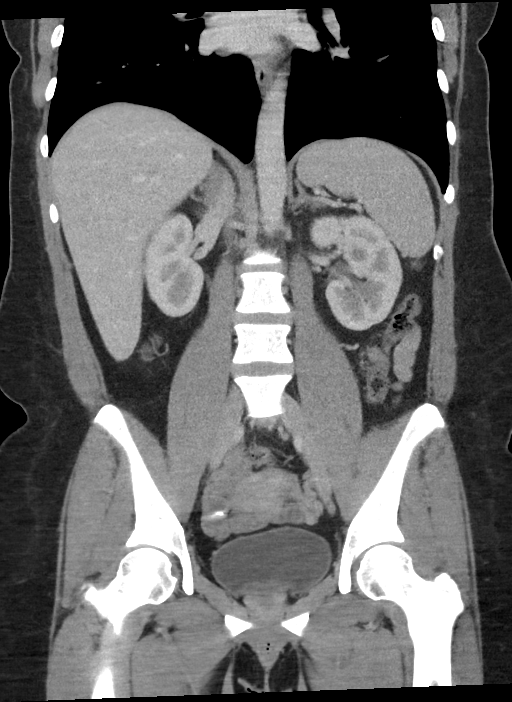

[16 of 46 positions shown; findings below may reference images not displayed]

FINDINGS: Lower chest: Lung bases are clear.

Hepatobiliary: Mild scarring inferiorly in the right hepatic lobe.
Liver is otherwise within normal limits.

Gallbladder is unremarkable. No intrahepatic or extrahepatic ductal
dilatation.

Pancreas: Focal fat along the posterior aspect of the uncinate
process (series 2/image 38), benign.

Spleen: Within normal limits.

Adrenals/Urinary Tract: Adrenal glands are within normal limits.

Kidneys are within normal limits. No hydronephrosis.

Bladder is within normal limits.

Stomach/Bowel: Stomach is within normal limits.

No evidence of bowel obstruction.

Normal appendix (series 2/image 59).

Mild colonic wall thickening along the hepatic flexure (series
2/image 45), although underdistended.

Vascular/Lymphatic: No evidence of abdominal aortic aneurysm.

Celiac artery, SMA, and IMA are patent.

No suspicious abdominopelvic lymphadenopathy.

Reproductive: Uterus is within normal limits.

Bilateral ovaries are within normal limits.

Other: Trace pelvic ascites, physiologic.

Musculoskeletal: Visualized osseous structures are within normal
limits.
IMPRESSION: No evidence of bowel obstruction.  Normal appendix.

Mild colonic wall thickening along the hepatic flexure, focal,
correlate for infectious/inflammatory colitis.

Otherwise, no CT findings to account for the patient's right lower
quadrant abdominal pain.

## 2021-02-26 ENCOUNTER — Ambulatory Visit: Payer: Self-pay

## 2021-02-26 ENCOUNTER — Ambulatory Visit
Admission: EM | Admit: 2021-02-26 | Discharge: 2021-02-26 | Disposition: A | Discharge status: Home / Self Care | Payer: Self-pay | Attending: Emergency Medicine | Admitting: Emergency Medicine

## 2021-02-26 ENCOUNTER — Other Ambulatory Visit: Payer: Self-pay

## 2021-02-26 DIAGNOSIS — H6983 Other specified disorders of Eustachian tube, bilateral: Secondary | ICD-10-CM

## 2021-02-26 DIAGNOSIS — H6690 Otitis media, unspecified, unspecified ear: Secondary | ICD-10-CM

## 2021-02-26 MED ORDER — BENZONATATE 100 MG PO CAPS: 100.0000 mg | ORAL_CAPSULE | Freq: Three times a day (TID) | ORAL | 0 refills | Status: DC

## 2021-02-26 MED ORDER — AZITHROMYCIN 250 MG PO TABS: 250.0000 mg | ORAL_TABLET | Freq: Every day | ORAL | 0 refills | Status: DC

## 2021-02-26 NOTE — ED Provider Notes (Signed)
[facility]    CSN: [id number] Arrival date & time: 02/26/21  1027      History   Chief Complaint Chief Complaint  Patient presents with   Headache   Sore Throat   Otalgia    bilateral   rash    HPI Zoe Evans is a 35 y.o. female.   Pt. C/o HA cough ST,ear pain for one week. OTC medications for cough. ST worse with swallowing. No n/v/d covid test at home negative.   Headache Associated symptoms: congestion, cough and ear pain   Sore Throat Associated symptoms include headaches.  Otalgia Associated symptoms: congestion, cough, headaches and rhinorrhea    Past Medical History:  Diagnosis Date   Anxiety    Back pain    Depression    Hearing loss in left ear    HPV (human papilloma virus) anogenital infection    Hypertension    Vaginal Pap smear, abnormal     Patient Active Problem List   Diagnosis Date Noted   H/O herpes genitalis 01/15/2021   Essential hypertension 04/05/2016   Migraine headache 08/09/2014   Depression with anxiety 07/01/2013    Past Surgical History:  Procedure Laterality Date   CESAREAN SECTION     CESAREAN SECTION     CESAREAN SECTION N/A 02/28/2018   Procedure: CESAREAN SECTION;  Surgeon: Levi Aland, MD;  Location: [facility];  Service: Obstetrics;  Laterality: N/A;  Heather RNFA - MD declined RNFA, [place] to assist    OB History     Gravida  3   Para  3   Term  2   Preterm  0   AB  0   Living  2      SAB  0   IAB  0   Ectopic  0   Multiple  0   Live Births  2            Home Medications    Prior to Admission medications   Medication Sig Start Date End Date Taking? Authorizing Provider  amLODipine (NORVASC) 5 MG tablet Take 1 tablet by mouth once daily 01/07/21   Ladona Ridgel, Malena M, DO  famotidine (PEPCID) 20 MG tablet Take 1 tablet (20 mg total) by mouth daily. 30 mins prior to eating. 11/26/20   Laroy Apple M, DO  hydrOXYzine (ATARAX/VISTARIL) 25 MG tablet Take 1  tablet (25 mg total) by mouth 3 (three) times daily as needed. for anxiety 01/15/21   Laroy Apple M, DO  losartan (COZAAR) 100 MG tablet Take 1 tablet by mouth once daily 12/24/20   Ladona Ridgel, Malena M, DO  ondansetron (ZOFRAN ODT) 4 MG disintegrating tablet Take 1 tablet (4 mg total) by mouth every 8 (eight) hours as needed for nausea or vomiting. 09/28/19   Ladona Ridgel, Malena M, DO  valACYclovir (VALTREX) 1000 MG tablet Take 1 tablet (1,000 mg total) by mouth daily. 11/26/20   Laroy Apple M, DO  venlafaxine XR (EFFEXOR-XR) 75 MG 24 hr capsule Take 1 capsule (75 mg total) by mouth daily with breakfast. 01/15/21   Annalee Genta, DO    Family History Family History  Problem Relation Age of Onset   Hypertension Mother    Cancer Maternal Aunt    Cancer Maternal Grandmother    Cancer Paternal Grandmother    Breast cancer Maternal Aunt 30       3 maternal cousins as well   Breast cancer Maternal Grandmother 46   Breast cancer Cousin  Breast cancer Cousin    Breast cancer Cousin     Social History Social History   Tobacco Use   Smoking status: Never   Smokeless tobacco: Never  Vaping Use   Vaping Use: Never used  Substance Use Topics   Alcohol use: Not Currently    Comment: occasional   Drug use: Never     Allergies   Augmentin [amoxicillin-pot clavulanate], Ace inhibitors, Buspar [buspirone], and Codeine   Review of Systems Review of Systems  HENT:  Positive for congestion, ear pain and rhinorrhea.   Respiratory:  Positive for cough.   Cardiovascular: Negative.   Gastrointestinal: Negative.   Genitourinary: Negative.   Neurological:  Positive for headaches.    Physical Exam Triage Vital Signs ED Triage Vitals  Enc Vitals Group     BP 02/26/21 1119 117/84     Pulse Rate 02/26/21 1119 80     Resp 02/26/21 1119 18     Temp 02/26/21 1119 98.5 F (36.9 C)     Temp Source 02/26/21 1119 Oral     SpO2 02/26/21 1119 94 %     Weight --      Height --      Head  Circumference --      Peak Flow --      Pain Score 02/26/21 1122 6     Pain Loc --      Pain Edu? --      Excl. in GC? --    No data found.  Updated Vital Signs BP 117/84 (BP Location: Right Arm)   Pulse 80   Temp 98.5 F (36.9 C) (Oral)   Resp 18   SpO2 94%   Visual Acuity Right Eye Distance:   Left Eye Distance:   Bilateral Distance:    Right Eye Near:   Left Eye Near:    Bilateral Near:     Physical Exam Constitutional:      General: She is not in acute distress.    Appearance: She is normal weight. She is not ill-appearing, toxic-appearing or diaphoretic.  HENT:     Head:     Comments: Left TM with erythema and effusion Neck:     Meningeal: Brudzinski's sign and Kernig's sign absent.  Cardiovascular:     Heart sounds: Normal heart sounds. No murmur heard.   No friction rub. No gallop.  Pulmonary:     Effort: Pulmonary effort is normal. No respiratory distress.     Breath sounds: Normal breath sounds. No stridor. No wheezing, rhonchi or rales.  Chest:     Chest wall: No tenderness.  Abdominal:     General: There is no distension.     Palpations: Abdomen is soft. There is no mass.     Tenderness: There is no abdominal tenderness. There is no guarding.  Musculoskeletal:        General: Normal range of motion.     Cervical back: Normal range of motion and neck supple. No rigidity.  Lymphadenopathy:     Cervical: No cervical adenopathy.  Skin:    General: Skin is warm.  Neurological:     Mental Status: She is alert.  Psychiatric:        Mood and Affect: Mood normal.     UC Treatments / Results  Labs (all labs ordered are listed, but only abnormal results are displayed) Labs Reviewed - No data to display  EKG   Radiology No results found.  Procedures Procedures (including critical care time)  Medications Ordered in UC Medications - No data to display  Initial Impression / Assessment and Plan / UC Course  I have reviewed the triage vital  signs and the nursing notes.  Pertinent labs & imaging results that were available during my care of the patient were reviewed by me and considered in my medical decision making (see chart for details).      Final Clinical Impressions(s) / UC Diagnoses   Final diagnoses:  None   Discharge Instructions   None    ED Prescriptions   None    PDMP not reviewed this encounter.   Faythe Casa [place], [place] 02/26/21 1136

## 2021-02-26 NOTE — ED Triage Notes (Signed)
4 day h/o bilateral ear pain, headache and sore throat with dysphagia. Onset today of pruritic rash on bilateral neck and shoulders. Has been taking tylenol cold and sinus with some relief. Denies n/v/d. Pt is covid vaccinated.

## 2021-04-10 ENCOUNTER — Other Ambulatory Visit: Payer: Self-pay

## 2021-04-10 ENCOUNTER — Ambulatory Visit (INDEPENDENT_AMBULATORY_CARE_PROVIDER_SITE_OTHER): Payer: Medicaid Other | Admitting: Nurse Practitioner

## 2021-04-10 VITALS — BP 116/85 | HR 86 | Temp 98.0°F | Wt 200.0 lb

## 2021-04-10 DIAGNOSIS — M25512 Pain in left shoulder: Secondary | ICD-10-CM

## 2021-04-10 DIAGNOSIS — R079 Chest pain, unspecified: Secondary | ICD-10-CM

## 2021-04-10 DIAGNOSIS — I10 Essential (primary) hypertension: Secondary | ICD-10-CM

## 2021-04-10 NOTE — Progress Notes (Signed)
   Subjective:    Patient ID: Zoe Evans, female    DOB: 1985-11-21, 35 y.o.   MRN: [id number]  HPI Patient states had elevated BP readings at work 140/104, reports L shoulder and arm pain and headaches started 5 days ago  On 11/7 patient checked her blood pressure at work states it was running 180/130.  Works in a [employer].  States she had slight blurred vision at that time.  Has been taking her blood pressure pills as prescribed.  Since then on average her pressures running 140s/low 100s during the day.  Denies any new medications including OTCs or herbals.  Denies any excessive sodium intake.  PMH: Migraines and hypertension.  Had a migraine about 2 days ago mainly on the right side which has resolved.  No change in migraine symptomatology.  Denies any difficulty speaking or swallowing.  No numbness or weakness of the face arms or legs.  No loss of vision.  Also has had some left shoulder pain worse with certain movements.  Describes as sharp and burning.  Began several days ago.  No history of injury.  At the beginning, noticed some tingling in her lower arm, no weakness.  Pain will radiate into the left upper chest wall at times.  No shortness of breath.  No cough.  No wheezing.  No edema.  No orthopnea.       Objective:   Physical Exam NAD.  Alert, oriented.  Mildly anxious affect.  Thyroid nontender to palpation, no mass or goiter noted.  Lungs clear.  Heart regular rate rhythm, no murmur noted.  EKG: NSR with a rate 73.  Lower extremities no edema.  BP on recheck left arm sitting 148/108. Can perform full active range of motion of the neck.  Can perform full active ROM of the left shoulder with some tenderness.  Slight limitation with passive ROM with full rotation of the shoulder above shoulder height.  Hand and arm strength 5+ bilaterally.  Strong left radial pulse. Today's Vitals   04/10/21 1540  BP: 116/85  Pulse: 86  Temp: 98 F (36.7 C)  SpO2: 98%  Weight: 200 lb (90.7  kg)   Body mass index is 35.43 kg/m.        Assessment & Plan:   Problem List Items Addressed This Visit       Cardiovascular and Mediastinum   Essential hypertension - Primary     Other   Acute pain of left shoulder   Other Visit Diagnoses     Chest pain, unspecified type       Relevant Orders   EKG 12-Lead (Completed)      Increase amlodipine to 10 mg daily.  Patient to monitor BP and to send results next week. Start conservative therapy for left shoulder pain.  Patient is uninsured so no further work-up or referral at this time.  Given a copy of shoulder exercises.  Ice/heat applications.  OTC topicals as directed.  Try OTC anti-inflammatories if no issues with her reflux. Warning signs reviewed.  Call back if symptoms worsen or persist.

## 2021-04-10 NOTE — Patient Instructions (Signed)
Increase Amlodipine to 2 pills daily (10 mg)

## 2021-04-11 ENCOUNTER — Encounter: Payer: Self-pay | Admitting: Nurse Practitioner

## 2021-04-11 DIAGNOSIS — M25512 Pain in left shoulder: Secondary | ICD-10-CM | POA: Insufficient documentation

## 2021-05-31 ENCOUNTER — Other Ambulatory Visit: Payer: Self-pay | Admitting: Family Medicine

## 2021-05-31 DIAGNOSIS — F418 Other specified anxiety disorders: Secondary | ICD-10-CM

## 2021-06-02 ENCOUNTER — Telehealth: Payer: Self-pay | Admitting: Family Medicine

## 2021-06-02 NOTE — Telephone Encounter (Signed)
Prescription was sent electronically to pharmacy. Patient notified. 

## 2021-06-02 NOTE — Telephone Encounter (Signed)
Patient is requesting refill on venlafaxine XR 75 mg 24 hr capsules it last filled 01/15/21 she states completely out last filled 01/15/21. She has appointment on 06/08/21. [facility]

## 2021-06-08 ENCOUNTER — Other Ambulatory Visit: Payer: Self-pay

## 2021-06-08 ENCOUNTER — Ambulatory Visit (INDEPENDENT_AMBULATORY_CARE_PROVIDER_SITE_OTHER): Payer: Self-pay | Admitting: Family Medicine

## 2021-06-08 DIAGNOSIS — F418 Other specified anxiety disorders: Secondary | ICD-10-CM

## 2021-06-08 DIAGNOSIS — I10 Essential (primary) hypertension: Secondary | ICD-10-CM

## 2021-06-08 MED ORDER — SERTRALINE HCL 50 MG PO TABS: 50.0000 mg | ORAL_TABLET | Freq: Every day | ORAL | 1 refills | Status: DC

## 2021-06-08 MED ORDER — VENLAFAXINE HCL ER 37.5 MG PO CP24: ORAL_CAPSULE | ORAL | 0 refills | Status: DC

## 2021-06-08 NOTE — Progress Notes (Signed)
Subjective:  Patient ID: Zoe Evans, female    DOB: 06-28-1985  Age: 36 y.o. MRN: [id number]  CC: Chief Complaint  Patient presents with   medication follow up     Has chronic headaches and worsened /more frequent w/ venlafaxine - 4 times weekly    HPI:  36 year old female presents for follow-up.  Patient has longstanding history of depression and anxiety.  She states that she is currently experiencing frequent headaches associated with Effexor.  Patient states that she has been on this medication for nearly a year.  She was previously on Celexa for many years and this was discontinued.  Patient's anxiety and depression is not well controlled.  PHQ-9: 12; GAD-7 -17.  Will discuss alternative options today.  Blood pressure mildly initially today.  Recheck was much improved.  She is compliant with amlodipine and losartan.   Patient Active Problem List   Diagnosis Date Noted   H/O herpes genitalis 01/15/2021   Essential hypertension 04/05/2016   Migraine headache 08/09/2014   Depression with anxiety 07/01/2013    Social Hx   Social History   Socioeconomic History   Marital status: Married    Spouse name: Not on file   Number of children: Not on file   Years of education: Not on file   Highest education level: Not on file  Occupational History   Not on file  Tobacco Use   Smoking status: Never   Smokeless tobacco: Never  Vaping Use   Vaping Use: Never used  Substance and Sexual Activity   Alcohol use: Not Currently    Comment: occasional   Drug use: Never   Sexual activity: Yes    Birth control/protection: Surgical  Other Topics Concern   Not on file  Social History Narrative   ** Merged History Encounter **       Social Determinants of Health   Financial Resource Strain: Not on file  Food Insecurity: Not on file  Transportation Needs: Not on file  Physical Activity: Not on file  Stress: Not on file  Social Connections: Not on file    Review of  Systems  Constitutional:  Positive for fatigue.  Neurological:  Positive for headaches.  Psychiatric/Behavioral:  The patient is nervous/anxious.     Objective:  BP 118/88    Pulse 91    Temp 98.2 F (36.8 C)    Ht 5\' 3"  (1.6 m)    Wt 194 lb (88 kg)    SpO2 98%    BMI 34.37 kg/m   BP/Weight 06/08/2021 04/10/2021 02/26/2021  Systolic BP 118 116 117  Diastolic BP 88 85 84  Wt. (Lbs) 194 200 -  BMI 34.37 35.43 -    Physical Exam Vitals and nursing note reviewed.  Constitutional:      General: She is not in acute distress.    Appearance: Normal appearance. She is not ill-appearing.  HENT:     Head: Normocephalic and atraumatic.  Cardiovascular:     Rate and Rhythm: Normal rate and regular rhythm.  Pulmonary:     Effort: Pulmonary effort is normal.     Breath sounds: Normal breath sounds. No wheezing, rhonchi or rales.  Neurological:     Mental Status: She is alert.  Psychiatric:     Comments: Flat affect; depressed mood.    Lab Results  Component Value Date   WBC 8.6 09/17/2019   HGB 12.7 09/17/2019   HCT 39.5 09/17/2019   PLT 319 09/17/2019   GLUCOSE  84 07/22/2020   CHOL 184 12/16/2015   TRIG 75 12/16/2015   HDL 58 12/16/2015   LDLCALC 111 (H) 12/16/2015   ALT 29 07/22/2020   AST 20 07/22/2020   NA 140 07/22/2020   K 3.4 (L) 07/22/2020   CL 102 07/22/2020   CREATININE 0.77 07/22/2020   BUN 11 07/22/2020   CO2 20 07/22/2020   TSH 1.250 12/16/2015   HGBA1C 5.3 04/04/2015     Assessment & Plan:   Problem List Items Addressed This Visit       Cardiovascular and Mediastinum   Essential hypertension    Stable.  Continue amlodipine and losartan.        Other   Depression with anxiety    Uncontrolled and having side effects from Effexor.  Tapering Effexor.  Starting Zoloft.  Follow-up in 6 weeks.      Relevant Medications   sertraline (ZOLOFT) 50 MG tablet   venlafaxine XR (EFFEXOR-XR) 37.5 MG 24 hr capsule    Meds ordered this encounter   Medications   sertraline (ZOLOFT) 50 MG tablet    Sig: Take 1 tablet (50 mg total) by mouth daily.    Dispense:  90 tablet    Refill:  1   venlafaxine XR (EFFEXOR-XR) 37.5 MG 24 hr capsule    Sig: 1 capsule daily for 7 days then discontinue.    Dispense:  7 capsule    Refill:  0    Follow-up:  Return in about 6 weeks (around 07/20/2021).  Everlene Other DO [facility]

## 2021-06-08 NOTE — Assessment & Plan Note (Signed)
Uncontrolled and having side effects from Effexor.  Tapering Effexor.  Starting Zoloft.  Follow-up in 6 weeks.

## 2021-06-08 NOTE — Patient Instructions (Signed)
Effexor every other day for 1 week, then the lower dose (37.5 mg) daily for 7 days then stop.  Okay to go ahead and start the Zoloft.  Follow up in 6 weeks.  Call with concerns.

## 2021-06-08 NOTE — Assessment & Plan Note (Signed)
Stable.  Continue amlodipine and losartan. 

## 2021-06-21 ENCOUNTER — Encounter: Payer: Self-pay | Admitting: Family Medicine

## 2021-07-22 ENCOUNTER — Ambulatory Visit (INDEPENDENT_AMBULATORY_CARE_PROVIDER_SITE_OTHER): Payer: Self-pay | Admitting: Family Medicine

## 2021-07-22 ENCOUNTER — Other Ambulatory Visit: Payer: Self-pay

## 2021-07-22 DIAGNOSIS — F418 Other specified anxiety disorders: Secondary | ICD-10-CM

## 2021-07-22 MED ORDER — CLONAZEPAM 0.5 MG PO TABS: 0.5000 mg | ORAL_TABLET | Freq: Two times a day (BID) | ORAL | 2 refills | Status: DC | PRN

## 2021-07-22 NOTE — Assessment & Plan Note (Signed)
Uncontrolled/worsening.  Continue Effexor.  Stopping Atarax.  Starting Klonopin.

## 2021-07-22 NOTE — Patient Instructions (Signed)
Follow up in 3-6 months.  Medication as directed.  Take care  Dr. Adriana Simas

## 2021-07-22 NOTE — Progress Notes (Signed)
Subjective:  Patient ID: Zoe Evans, female    DOB: Jan 13, 1986  Age: 36 y.o. MRN: [id number]  CC: Chief Complaint  Patient presents with   anxiety / depression follow up     Stopped sertraline , is taking venlafaxine    HPI:  36 year old female presents for follow-up regarding anxiety and depression.  Patient started on Zoloft at last visit.  She did not tolerate medication and it seemed to worsen her depression.  Zoloft was subsequently stopped.  Patient has since restarted Effexor which she was on previously.  She continues to use Atarax as needed for anxiety and to help with sleep.  Patient is still struggling.  She is dealing more so with anxiety at this time.  GAD-7 score of 16.  PHQ-9 score of 7.  Patient states that she is a single mother taking care of 2 kids.  Her husband is incarcerated.  She states that she worries a lot.  She states that she has past trauma as well.  She states that she does not have insurance at this time.  She is interested in seeing a therapist if she can get health insurance.  Patient Active Problem List   Diagnosis Date Noted   H/O herpes genitalis 01/15/2021   Essential hypertension 04/05/2016   Migraine headache 08/09/2014   Depression with anxiety 07/01/2013    Social Hx   Social History   Socioeconomic History   Marital status: Married    Spouse name: Not on file   Number of children: Not on file   Years of education: Not on file   Highest education level: Not on file  Occupational History   Not on file  Tobacco Use   Smoking status: Never   Smokeless tobacco: Never  Vaping Use   Vaping Use: Never used  Substance and Sexual Activity   Alcohol use: Not Currently    Comment: occasional   Drug use: Never   Sexual activity: Yes    Birth control/protection: Surgical  Other Topics Concern   Not on file  Social History Narrative   ** Merged History Encounter **       Social Determinants of Health   Financial Resource Strain:  Not on file  Food Insecurity: Not on file  Transportation Needs: Not on file  Physical Activity: Not on file  Stress: Not on file  Social Connections: Not on file    Review of Systems Per HPI  Objective:  BP 126/86    Pulse (!) 101    Temp 98.3 F (36.8 C)    Ht 5\' 3"  (1.6 m)    Wt 193 lb (87.5 kg)    SpO2 98%    BMI 34.19 kg/m   BP/Weight 07/22/2021 06/08/2021 04/10/2021  Systolic BP 126 118 116  Diastolic BP 86 88 85  Wt. (Lbs) 193 194 200  BMI 34.19 34.37 35.43    Physical Exam Vitals and nursing note reviewed.  Constitutional:      Appearance: Normal appearance.  HENT:     Head: Normocephalic and atraumatic.  Cardiovascular:     Rate and Rhythm: Normal rate and regular rhythm.  Pulmonary:     Effort: Pulmonary effort is normal.     Breath sounds: Normal breath sounds. No wheezing, rhonchi or rales.  Neurological:     Mental Status: She is alert.  Psychiatric:        Mood and Affect: Mood normal.        Behavior: Behavior normal.  Lab Results  Component Value Date   WBC 8.6 09/17/2019   HGB 12.7 09/17/2019   HCT 39.5 09/17/2019   PLT 319 09/17/2019   GLUCOSE 84 07/22/2020   CHOL 184 12/16/2015   TRIG 75 12/16/2015   HDL 58 12/16/2015   LDLCALC 111 (H) 12/16/2015   ALT 29 07/22/2020   AST 20 07/22/2020   NA 140 07/22/2020   K 3.4 (L) 07/22/2020   CL 102 07/22/2020   CREATININE 0.77 07/22/2020   BUN 11 07/22/2020   CO2 20 07/22/2020   TSH 1.250 12/16/2015   HGBA1C 5.3 04/04/2015     Assessment & Plan:   Problem List Items Addressed This Visit       Other   Depression with anxiety    Uncontrolled/worsening.  Continue Effexor.  Stopping Atarax.  Starting Klonopin.       Meds ordered this encounter  Medications   clonazePAM (KLONOPIN) 0.5 MG tablet    Sig: Take 1 tablet (0.5 mg total) by mouth 2 (two) times daily as needed for anxiety.    Dispense:  30 tablet    Refill:  2    Follow-up:  3-6 months  Zoe Evans Zoe Simas DO [facility]

## 2021-07-31 ENCOUNTER — Other Ambulatory Visit: Payer: Self-pay | Admitting: Family Medicine

## 2021-07-31 DIAGNOSIS — I10 Essential (primary) hypertension: Secondary | ICD-10-CM

## 2021-09-01 ENCOUNTER — Other Ambulatory Visit: Payer: Self-pay | Admitting: Family Medicine

## 2021-09-01 DIAGNOSIS — F418 Other specified anxiety disorders: Secondary | ICD-10-CM

## 2021-09-03 ENCOUNTER — Encounter: Payer: Self-pay | Admitting: *Deleted

## 2021-11-06 ENCOUNTER — Other Ambulatory Visit: Payer: Self-pay | Admitting: Family Medicine

## 2021-11-06 DIAGNOSIS — I10 Essential (primary) hypertension: Secondary | ICD-10-CM

## 2021-11-24 ENCOUNTER — Other Ambulatory Visit: Payer: Self-pay | Admitting: Family Medicine

## 2021-11-24 DIAGNOSIS — F418 Other specified anxiety disorders: Secondary | ICD-10-CM

## 2021-11-29 ENCOUNTER — Other Ambulatory Visit: Payer: Self-pay | Admitting: Family Medicine

## 2021-11-29 DIAGNOSIS — I10 Essential (primary) hypertension: Secondary | ICD-10-CM

## 2021-12-30 ENCOUNTER — Other Ambulatory Visit: Payer: Self-pay | Admitting: Family Medicine

## 2021-12-30 DIAGNOSIS — I10 Essential (primary) hypertension: Secondary | ICD-10-CM

## 2022-01-19 ENCOUNTER — Encounter: Payer: Self-pay | Admitting: Family Medicine

## 2022-01-19 ENCOUNTER — Ambulatory Visit (INDEPENDENT_AMBULATORY_CARE_PROVIDER_SITE_OTHER): Payer: Medicaid Other | Admitting: Family Medicine

## 2022-01-19 DIAGNOSIS — K219 Gastro-esophageal reflux disease without esophagitis: Secondary | ICD-10-CM

## 2022-01-19 DIAGNOSIS — I10 Essential (primary) hypertension: Secondary | ICD-10-CM

## 2022-01-19 DIAGNOSIS — F418 Other specified anxiety disorders: Secondary | ICD-10-CM

## 2022-01-19 MED ORDER — FAMOTIDINE 20 MG PO TABS: 20.0000 mg | ORAL_TABLET | Freq: Every day | ORAL | 3 refills | Status: DC

## 2022-01-19 MED ORDER — VALACYCLOVIR HCL 1 G PO TABS: 1000.0000 mg | ORAL_TABLET | Freq: Every day | ORAL | 3 refills | Status: AC

## 2022-01-19 MED ORDER — CLONAZEPAM 0.5 MG PO TABS: 0.5000 mg | ORAL_TABLET | Freq: Two times a day (BID) | ORAL | 3 refills | Status: DC | PRN

## 2022-01-19 MED ORDER — VENLAFAXINE HCL ER 75 MG PO CP24: 75.0000 mg | ORAL_CAPSULE | Freq: Every day | ORAL | 3 refills | Status: DC

## 2022-01-19 MED ORDER — LOSARTAN POTASSIUM 100 MG PO TABS: 100.0000 mg | ORAL_TABLET | Freq: Every day | ORAL | 3 refills | Status: DC

## 2022-01-19 MED ORDER — AMLODIPINE BESYLATE 5 MG PO TABS: 5.0000 mg | ORAL_TABLET | Freq: Every day | ORAL | 3 refills | Status: DC

## 2022-01-19 NOTE — Patient Instructions (Signed)
Continue your medications.  Follow up in 6 months.  Take care  Dr. Lateya Dauria  

## 2022-01-20 NOTE — Progress Notes (Signed)
Subjective:  Patient ID: Zoe Evans, female    DOB: 01/20/1986  Age: 36 y.o. MRN: [id number]  CC: Chief Complaint  Patient presents with   Depression with anxiety    Pt having hot flashes/cold sweats/night sweats(premenopausal?). Still having lots of anxiety.     HPI:  36 year old female presents for follow-up.  Patient reports that her depression and anxiety seems to be stable.  GAD-7 score of 15.  PHQ-9 score of 7.  Endorses compliance with Effexor and clonazepam.  Overall she states that she feels like she is doing okay.  Hypertension is at goal on losartan and amlodipine.  Excluding COVID-19 vaccination, patient's preventative health care is up-to-date.  Patient Active Problem List   Diagnosis Date Noted   H/O herpes genitalis 01/15/2021   Essential hypertension 04/05/2016   Migraine headache 08/09/2014   Depression with anxiety 07/01/2013    Social Hx   Social History   Socioeconomic History   Marital status: Married    Spouse name: Not on file   Number of children: Not on file   Years of education: Not on file   Highest education level: Not on file  Occupational History   Not on file  Tobacco Use   Smoking status: Never   Smokeless tobacco: Never  Vaping Use   Vaping Use: Never used  Substance and Sexual Activity   Alcohol use: Not Currently    Comment: occasional   Drug use: Never   Sexual activity: Yes    Birth control/protection: Surgical  Other Topics Concern   Not on file  Social History Narrative   ** Merged History Encounter **       Social Determinants of Health   Financial Resource Strain: Low Risk  (02/15/2018)   Overall Financial Resource Strain (CARDIA)    Difficulty of Paying Living Expenses: Not hard at all  Food Insecurity: No Food Insecurity (02/15/2018)   Hunger Vital Sign    Worried About Running Out of Food in the Last Year: Never true    Ran Out of Food in the Last Year: Never true  Transportation Needs: Unknown  (02/15/2018)   PRAPARE - Administrator, Civil Service (Medical): No    Lack of Transportation (Non-Medical): Not on file  Physical Activity: Not on file  Stress: Stress Concern Present (02/15/2018)   [employer] of Occupational Health - Occupational Stress Questionnaire    Feeling of Stress : To some extent  Social Connections: Not on file    Review of Systems Per HPI  Objective:  BP 119/89   Pulse 84   Temp 97.9 F (36.6 C)   Wt 201 lb 3.2 oz (91.3 kg)   SpO2 99%   BMI 35.64 kg/m      01/19/2022    2:46 PM 07/22/2021    2:42 PM 06/08/2021   11:06 AM  BP/Weight  Systolic BP 119 126 118  Diastolic BP 89 86 88  Wt. (Lbs) 201.2 193   BMI 35.64 kg/m2 34.19 kg/m2     Physical Exam Vitals and nursing note reviewed.  Constitutional:      General: She is not in acute distress.    Appearance: Normal appearance.  HENT:     Head: Normocephalic and atraumatic.  Cardiovascular:     Rate and Rhythm: Normal rate and regular rhythm.  Pulmonary:     Effort: Pulmonary effort is normal.     Breath sounds: Normal breath sounds. No wheezing, rhonchi or rales.  Neurological:  Mental Status: She is alert.  Psychiatric:        Mood and Affect: Mood normal.        Behavior: Behavior normal.     Lab Results  Component Value Date   WBC 8.6 09/17/2019   HGB 12.7 09/17/2019   HCT 39.5 09/17/2019   PLT 319 09/17/2019   GLUCOSE 84 07/22/2020   CHOL 184 12/16/2015   TRIG 75 12/16/2015   HDL 58 12/16/2015   LDLCALC 111 (H) 12/16/2015   ALT 29 07/22/2020   AST 20 07/22/2020   NA 140 07/22/2020   K 3.4 (L) 07/22/2020   CL 102 07/22/2020   CREATININE 0.77 07/22/2020   BUN 11 07/22/2020   CO2 20 07/22/2020   TSH 1.250 12/16/2015   HGBA1C 5.3 04/04/2015     Assessment & Plan:   Problem List Items Addressed This Visit       Cardiovascular and Mediastinum   Essential hypertension    Well-controlled.  Continue losartan and amlodipine.  Medications refilled  today.      Relevant Medications   losartan (COZAAR) 100 MG tablet   amLODipine (NORVASC) 5 MG tablet     Other   Depression with anxiety    Patient appears to be doing fairly well despite GAD-7 score.  Patient does not want any medication changes today.  Continue Effexor and clonazepam.      Relevant Medications   venlafaxine XR (EFFEXOR-XR) 75 MG 24 hr capsule   Other Visit Diagnoses     Gastroesophageal reflux disease, unspecified whether esophagitis present       Relevant Medications   famotidine (PEPCID) 20 MG tablet       Meds ordered this encounter  Medications   venlafaxine XR (EFFEXOR-XR) 75 MG 24 hr capsule    Sig: Take 1 capsule (75 mg total) by mouth daily with breakfast.    Dispense:  90 capsule    Refill:  3   valACYclovir (VALTREX) 1000 MG tablet    Sig: Take 1 tablet (1,000 mg total) by mouth daily.    Dispense:  90 tablet    Refill:  3    Please consider 90 day supplies to promote better adherence   losartan (COZAAR) 100 MG tablet    Sig: Take 1 tablet (100 mg total) by mouth daily.    Dispense:  90 tablet    Refill:  3    Needs office visit   famotidine (PEPCID) 20 MG tablet    Sig: Take 1 tablet (20 mg total) by mouth daily. 30 mins prior to eating.    Dispense:  90 tablet    Refill:  3   amLODipine (NORVASC) 5 MG tablet    Sig: Take 1 tablet (5 mg total) by mouth daily.    Dispense:  90 tablet    Refill:  3   clonazePAM (KLONOPIN) 0.5 MG tablet    Sig: Take 1 tablet (0.5 mg total) by mouth 2 (two) times daily as needed for anxiety.    Dispense:  30 tablet    Refill:  3    Follow-up: 6 months  Adonis Ryther Adriana Simas DO [facility]

## 2022-01-20 NOTE — Assessment & Plan Note (Signed)
Well-controlled.  Continue losartan and amlodipine.  Medications refilled today.

## 2022-01-20 NOTE — Assessment & Plan Note (Signed)
Patient appears to be doing fairly well despite GAD-7 score.  Patient does not want any medication changes today.  Continue Effexor and clonazepam.

## 2022-09-23 ENCOUNTER — Ambulatory Visit: Payer: BC Managed Care – PPO | Admitting: Family Medicine

## 2022-09-23 DIAGNOSIS — M2550 Pain in unspecified joint: Secondary | ICD-10-CM

## 2022-09-23 DIAGNOSIS — I10 Essential (primary) hypertension: Secondary | ICD-10-CM | POA: Diagnosis not present

## 2022-09-23 DIAGNOSIS — R5383 Other fatigue: Secondary | ICD-10-CM | POA: Diagnosis not present

## 2022-09-23 DIAGNOSIS — M791 Myalgia, unspecified site: Secondary | ICD-10-CM

## 2022-09-23 DIAGNOSIS — E611 Iron deficiency: Secondary | ICD-10-CM | POA: Diagnosis not present

## 2022-09-23 MED ORDER — DICLOFENAC SODIUM 75 MG PO TBEC: 75.0000 mg | DELAYED_RELEASE_TABLET | Freq: Two times a day (BID) | ORAL | 0 refills | Status: DC | PRN

## 2022-09-23 NOTE — Progress Notes (Signed)
Subjective:  Patient ID: Zoe Evans, female    DOB: 04-20-1986  Age: 37 y.o. MRN: [id number]  CC: Chief Complaint  Patient presents with   Muscle Pain    Legs throbbing    Joint Pain    knees   Fatigue    Exhausted can't sleep   Breast Pain    Aching pain    HPI:  37 year old female presents for evaluation patient states that she has had symptoms for 3 weeks.  She has had ongoing pain in a variety of locations: No aspect of the knees, lateral thighs, low back, lateral breast and flank, neck, and calves.  Pain is worse at night.  Also seems to be worse after significant activity.  However, she often finds relief when she moves her feet around.  She states that the pain is achy and then is throbbing at night.  No fever.  No weight loss.  She does note nausea and decreased appetite.  She reports night sweats.  Patient also has had ongoing migraine headaches.  This is relatively common for her.  No relieving factors.  No reported family history of rheumatologic disease.  She states that she does have a family history of cancer.  She also has an aunt with fibromyalgia.  Additionally, patient states that she recently tried to get blood and was told that her iron was low.  Patient Active Problem List   Diagnosis Date Noted   Other fatigue 09/23/2022   Iron deficiency 09/23/2022   Myalgia 09/23/2022   Arthralgia 09/23/2022   H/O herpes genitalis 01/15/2021   Essential hypertension 04/05/2016   Migraine headache 08/09/2014   Depression with anxiety 07/01/2013    Social Hx   Social History   Socioeconomic History   Marital status: Married    Spouse name: Not on file   Number of children: Not on file   Years of education: Not on file   Highest education level: Not on file  Occupational History   Not on file  Tobacco Use   Smoking status: Never   Smokeless tobacco: Never  Vaping Use   Vaping Use: Never used  Substance and Sexual Activity   Alcohol use: Not Currently     Comment: occasional   Drug use: Never   Sexual activity: Yes    Birth control/protection: Surgical  Other Topics Concern   Not on file  Social History Narrative   ** Merged History Encounter **       Social Determinants of Health   Financial Resource Strain: Low Risk  (02/15/2018)   Overall Financial Resource Strain (CARDIA)    Difficulty of Paying Living Expenses: Not hard at all  Food Insecurity: No Food Insecurity (02/15/2018)   Hunger Vital Sign    Worried About Running Out of Food in the Last Year: Never true    Ran Out of Food in the Last Year: Never true  Transportation Needs: Unknown (02/15/2018)   PRAPARE - Administrator, Civil Service (Medical): No    Lack of Transportation (Non-Medical): Not on file  Physical Activity: Not on file  Stress: Stress Concern Present (02/15/2018)   [employer] of Occupational Health - Occupational Stress Questionnaire    Feeling of Stress : To some extent  Social Connections: Not on file    Review of Systems Per HPI  Objective:  BP 127/85   Pulse 87   Temp 98.2 F (36.8 C) (Temporal)   Ht  (1.6 m)  Wt 199 lb 6.4 oz (90.4 kg)   SpO2 97%   BMI 35.32 kg/m      09/23/2022    4:12 PM 01/19/2022    2:46 PM 07/22/2021    2:42 PM  BP/Weight  Systolic BP 127 119 126  Diastolic BP 85 89 86  Wt. (Lbs) 199.4 201.2 193  BMI 35.32 kg/m2 35.64 kg/m2 34.19 kg/m2    Physical Exam Vitals and nursing note reviewed.  Constitutional:      General: She is not in acute distress.    Appearance: Normal appearance.  HENT:     Head: Normocephalic and atraumatic.  Eyes:     General:        Right eye: No discharge.        Left eye: No discharge.     Conjunctiva/sclera: Conjunctivae normal.  Cardiovascular:     Rate and Rhythm: Normal rate and regular rhythm.  Pulmonary:     Effort: Pulmonary effort is normal.     Breath sounds: Normal breath sounds. No wheezing, rhonchi or rales.  Abdominal:     General: There  is no distension.     Palpations: Abdomen is soft.  Musculoskeletal:     Comments: Patient with several areas of tenderness to palpation -medial knees, lateral thighs  Neurological:     Mental Status: She is alert.  Psychiatric:     Comments: Flat affect.  Depressed mood.     Lab Results  Component Value Date   WBC 8.6 09/17/2019   HGB 12.7 09/17/2019   HCT 39.5 09/17/2019   PLT 319 09/17/2019   GLUCOSE 84 07/22/2020   CHOL 184 12/16/2015   TRIG 75 12/16/2015   HDL 58 12/16/2015   LDLCALC 111 (H) 12/16/2015   ALT 29 07/22/2020   AST 20 07/22/2020   NA 140 07/22/2020   K 3.4 (L) 07/22/2020   CL 102 07/22/2020   CREATININE 0.77 07/22/2020   BUN 11 07/22/2020   CO2 20 07/22/2020   TSH 1.250 12/16/2015   HGBA1C 5.3 04/04/2015     Assessment & Plan:   Problem List Items Addressed This Visit       Cardiovascular and Mediastinum   Essential hypertension   Relevant Orders   CMP14+EGFR     Other   Arthralgia   Relevant Orders   ANA   Sedimentation Rate   CYCLIC CITRUL PEPTIDE ANTIBODY, IGG/IGA   Rheumatoid factor   Iron deficiency   Relevant Orders   CBC   Iron, TIBC and Ferritin Panel   Myalgia    Patient with myalgia, arthralgia, and fatigue.  Etiology and prognosis unclear at this time.  Proceeding with laboratory workup.  Diclofenac as needed for pain.      Relevant Orders   ANA   Sedimentation Rate   CYCLIC CITRUL PEPTIDE ANTIBODY, IGG/IGA   Rheumatoid factor   CK   Other fatigue   Relevant Orders   CBC   Vitamin B12   Folate   Vitamin D, 25-hydroxy   TSH + free T4   T3, Free    Meds ordered this encounter  Medications   diclofenac (VOLTAREN) 75 MG EC tablet    Sig: Take 1 tablet (75 mg total) by mouth 2 (two) times daily as needed for mild pain or moderate pain.    Dispense:  60 tablet    Refill:  0    Follow-up: Pending workup  Balraj Brayfield Adriana Simas DO [facility]

## 2022-09-23 NOTE — Assessment & Plan Note (Addendum)
Patient with myalgia, arthralgia, and fatigue.  Etiology and prognosis unclear at this time.  Proceeding with laboratory workup.  Diclofenac as needed for pain.

## 2022-09-23 NOTE — Patient Instructions (Signed)
Labs when you can.  Medication as directed.  We will call with results.  Take care  Dr Adriana Simas

## 2022-09-24 DIAGNOSIS — E611 Iron deficiency: Secondary | ICD-10-CM | POA: Diagnosis not present

## 2022-09-24 DIAGNOSIS — M791 Myalgia, unspecified site: Secondary | ICD-10-CM | POA: Diagnosis not present

## 2022-09-24 DIAGNOSIS — M2550 Pain in unspecified joint: Secondary | ICD-10-CM | POA: Diagnosis not present

## 2022-09-24 DIAGNOSIS — R5383 Other fatigue: Secondary | ICD-10-CM | POA: Diagnosis not present

## 2022-09-24 DIAGNOSIS — I10 Essential (primary) hypertension: Secondary | ICD-10-CM | POA: Diagnosis not present

## 2022-09-27 ENCOUNTER — Other Ambulatory Visit: Payer: Self-pay | Admitting: Family Medicine

## 2022-09-27 MED ORDER — IRON (FERROUS SULFATE) 325 (65 FE) MG PO TABS: 325.0000 mg | ORAL_TABLET | ORAL | 0 refills | Status: DC

## 2022-09-28 LAB — CBC
Hematocrit: 37.3 % (ref 34.0–46.6)
Hemoglobin: 12 g/dL (ref 11.1–15.9)
MCH: 25.8 pg — ABNORMAL LOW (ref 26.6–33.0)
MCHC: 32.2 g/dL (ref 31.5–35.7)
MCV: 80 fL (ref 79–97)
Platelets: 328 10*3/uL (ref 150–450)
RBC: 4.66 x10E6/uL (ref 3.77–5.28)
RDW: 13.8 % (ref 11.7–15.4)
WBC: 7.3 10*3/uL (ref 3.4–10.8)

## 2022-09-28 LAB — IRON,TIBC AND FERRITIN PANEL
Ferritin: 8 ng/mL — ABNORMAL LOW (ref 15–150)
Iron Saturation: 17 % (ref 15–55)
Iron: 62 ug/dL (ref 27–159)
Total Iron Binding Capacity: 367 ug/dL (ref 250–450)
UIBC: 305 ug/dL (ref 131–425)

## 2022-09-28 LAB — TSH+FREE T4
Free T4: 0.98 ng/dL (ref 0.82–1.77)
TSH: 1.03 u[IU]/mL (ref 0.450–4.500)

## 2022-09-28 LAB — CMP14+EGFR
ALT: 26 IU/L (ref 0–32)
AST: 27 IU/L (ref 0–40)
Albumin/Globulin Ratio: 1.9 (ref 1.2–2.2)
Albumin: 4.4 g/dL (ref 3.9–4.9)
Alkaline Phosphatase: 59 IU/L (ref 44–121)
BUN/Creatinine Ratio: 21 (ref 9–23)
BUN: 16 mg/dL (ref 6–20)
Bilirubin Total: 0.2 mg/dL (ref 0.0–1.2)
CO2: 18 mmol/L — ABNORMAL LOW (ref 20–29)
Calcium: 9.1 mg/dL (ref 8.7–10.2)
Chloride: 103 mmol/L (ref 96–106)
Creatinine, Ser: 0.78 mg/dL (ref 0.57–1.00)
Globulin, Total: 2.3 g/dL (ref 1.5–4.5)
Glucose: 85 mg/dL (ref 70–99)
Potassium: 4.5 mmol/L (ref 3.5–5.2)
Sodium: 137 mmol/L (ref 134–144)
Total Protein: 6.7 g/dL (ref 6.0–8.5)
eGFR: 100 mL/min/{1.73_m2} (ref >59)

## 2022-09-28 LAB — FOLATE: Folate: 8.5 ng/mL (ref >3.0)

## 2022-09-28 LAB — VITAMIN B12: Vitamin B-12: 444 pg/mL (ref 232–1245)

## 2022-09-28 LAB — T3, FREE: T3, Free: 2.8 pg/mL (ref 2.0–4.4)

## 2022-09-28 LAB — SEDIMENTATION RATE: Sed Rate: 4 mm/hr (ref 0–32)

## 2022-09-28 LAB — CK: Total CK: 119 U/L (ref 32–182)

## 2022-09-28 LAB — RHEUMATOID FACTOR: Rheumatoid fact SerPl-aCnc: <10 IU/mL (ref <14.0)

## 2022-09-28 LAB — CYCLIC CITRUL PEPTIDE ANTIBODY, IGG/IGA: Cyclic Citrullin Peptide Ab: 5 units (ref 0–19)

## 2022-09-28 LAB — ANA: Anti Nuclear Antibody (ANA): NEGATIVE

## 2022-09-28 LAB — VITAMIN D 25 HYDROXY (VIT D DEFICIENCY, FRACTURES): Vit D, 25-Hydroxy: 29.4 ng/mL — ABNORMAL LOW (ref 30.0–100.0)

## 2022-10-21 ENCOUNTER — Other Ambulatory Visit: Payer: Self-pay | Admitting: Family Medicine

## 2022-11-19 ENCOUNTER — Other Ambulatory Visit: Payer: Self-pay | Admitting: Family Medicine

## 2022-12-01 DIAGNOSIS — Z8042 Family history of malignant neoplasm of prostate: Secondary | ICD-10-CM | POA: Diagnosis not present

## 2022-12-01 DIAGNOSIS — Z113 Encounter for screening for infections with a predominantly sexual mode of transmission: Secondary | ICD-10-CM | POA: Diagnosis not present

## 2022-12-01 DIAGNOSIS — Z803 Family history of malignant neoplasm of breast: Secondary | ICD-10-CM | POA: Diagnosis not present

## 2022-12-01 DIAGNOSIS — Z8041 Family history of malignant neoplasm of ovary: Secondary | ICD-10-CM | POA: Diagnosis not present

## 2022-12-01 DIAGNOSIS — Z124 Encounter for screening for malignant neoplasm of cervix: Secondary | ICD-10-CM | POA: Diagnosis not present

## 2022-12-01 DIAGNOSIS — Z01419 Encounter for gynecological examination (general) (routine) without abnormal findings: Secondary | ICD-10-CM | POA: Diagnosis not present

## 2022-12-01 DIAGNOSIS — Z6835 Body mass index (BMI) 35.0-35.9, adult: Secondary | ICD-10-CM | POA: Diagnosis not present

## 2022-12-01 DIAGNOSIS — Z801 Family history of malignant neoplasm of trachea, bronchus and lung: Secondary | ICD-10-CM | POA: Diagnosis not present

## 2022-12-14 DIAGNOSIS — N760 Acute vaginitis: Secondary | ICD-10-CM | POA: Diagnosis not present

## 2022-12-14 DIAGNOSIS — H6691 Otitis media, unspecified, right ear: Secondary | ICD-10-CM | POA: Diagnosis not present

## 2022-12-24 ENCOUNTER — Other Ambulatory Visit: Payer: Self-pay | Admitting: Family Medicine

## 2023-01-05 NOTE — Progress Notes (Signed)
This encounter was created in error - please disregard.

## 2023-01-13 DIAGNOSIS — Z3202 Encounter for pregnancy test, result negative: Secondary | ICD-10-CM | POA: Diagnosis not present

## 2023-01-13 DIAGNOSIS — Z803 Family history of malignant neoplasm of breast: Secondary | ICD-10-CM | POA: Diagnosis not present

## 2023-01-13 DIAGNOSIS — Z30430 Encounter for insertion of intrauterine contraceptive device: Secondary | ICD-10-CM | POA: Diagnosis not present

## 2023-01-13 DIAGNOSIS — Z1231 Encounter for screening mammogram for malignant neoplasm of breast: Secondary | ICD-10-CM | POA: Diagnosis not present

## 2023-01-26 ENCOUNTER — Other Ambulatory Visit: Payer: Self-pay | Admitting: Family Medicine

## 2023-01-26 DIAGNOSIS — I10 Essential (primary) hypertension: Secondary | ICD-10-CM

## 2023-02-07 DIAGNOSIS — E669 Obesity, unspecified: Secondary | ICD-10-CM | POA: Diagnosis not present

## 2023-02-07 DIAGNOSIS — R030 Elevated blood-pressure reading, without diagnosis of hypertension: Secondary | ICD-10-CM | POA: Diagnosis not present

## 2023-02-07 DIAGNOSIS — J069 Acute upper respiratory infection, unspecified: Secondary | ICD-10-CM | POA: Diagnosis not present

## 2023-02-07 DIAGNOSIS — Z6832 Body mass index (BMI) 32.0-32.9, adult: Secondary | ICD-10-CM | POA: Diagnosis not present

## 2023-03-06 ENCOUNTER — Other Ambulatory Visit: Payer: Self-pay | Admitting: Family Medicine

## 2023-03-06 DIAGNOSIS — F418 Other specified anxiety disorders: Secondary | ICD-10-CM

## 2023-04-08 DIAGNOSIS — Z3202 Encounter for pregnancy test, result negative: Secondary | ICD-10-CM | POA: Diagnosis not present

## 2023-04-08 DIAGNOSIS — Z30430 Encounter for insertion of intrauterine contraceptive device: Secondary | ICD-10-CM | POA: Diagnosis not present

## 2023-04-08 DIAGNOSIS — Z30432 Encounter for removal of intrauterine contraceptive device: Secondary | ICD-10-CM | POA: Diagnosis not present

## 2023-04-29 ENCOUNTER — Other Ambulatory Visit: Payer: Self-pay | Admitting: Family Medicine

## 2023-04-29 DIAGNOSIS — I10 Essential (primary) hypertension: Secondary | ICD-10-CM

## 2023-05-22 ENCOUNTER — Other Ambulatory Visit: Payer: Self-pay | Admitting: Family Medicine

## 2023-05-22 DIAGNOSIS — I10 Essential (primary) hypertension: Secondary | ICD-10-CM

## 2023-07-10 ENCOUNTER — Other Ambulatory Visit: Payer: Self-pay | Admitting: Family Medicine

## 2023-07-11 ENCOUNTER — Ambulatory Visit: Payer: Self-pay | Admitting: Family Medicine

## 2023-07-11 NOTE — Telephone Encounter (Addendum)
 Chief Complaint: HTN & Chest pain Symptoms: Chest pain, chest tightness, sometimes radiates into shoulers Frequency: a month Pertinent Negatives: Patient denies n/a Disposition: [x] ED /[] Urgent Care (no appt availability in office) / [] Appointment(In office/virtual)/ []  [facility]/ [] Home Care/ [] Refused Recommended Disposition /[] [facility]/ []  Follow-up with PCP Additional Notes: Patient called in stating she has been experiencing high blood pressure even while being on her prescribed daily medications, Losartan  and Amlodopine. Patient states it was running 180's/120's and has trended down to run 150's/110s. Patient's bp today was 150/110 and patient is complaining of chest pain/tightness in between breasts. Patient denies weakness of arms or legs, but states sometimes at night her legs will go numb. Advised patient to be seen at ED today for evaluation and to follow up with PCP after ED evaluation. Patient agreed and verbalized understanding and stated she will be going to ED in [place] for evaluation.    Copied from CRM [id number]. Topic: Clinical - Red Word Triage >> Jul 11, 2023 12:21 PM Zoe Evans wrote: Red Word that prompted transfer to Nurse Triage: High blood pressure, 150/110. Having chest pains as well. Looking to come in for an appt. Reason for Disposition  [1] Systolic BP  >= 160 OR Diastolic >= 100 AND [2] cardiac (e.g., breathing difficulty, chest pain) or neurologic symptoms (e.g., new-onset blurred or double vision, unsteady gait)  Answer Assessment - Initial Assessment Questions 1. BLOOD PRESSURE: "What is the blood pressure?" "Did you take at least two measurements 5 minutes apart?"     145/110 about 10 minutes ago 2. ONSET: "When did you take your blood pressure?"     About a month 3. HOW: "How did you take your blood pressure?" (e.g., automatic home BP monitor, visiting nurse)     Automatic cuff 4. HISTORY: "Do you have a history of high blood  pressure?"     Yes 5. MEDICINES: "Are you taking any medicines for blood pressure?" "Have you missed any doses recently?"     Amlodopine and Losartan  6. OTHER SYMPTOMS: "Do you have any symptoms?" (e.g., blurred vision, chest pain, difficulty breathing, headache, weakness)     Chest pain, tough time taking deep breaths 7. PREGNANCY: "Is there any chance you are pregnant?" "When was your last menstrual period?"     No  Protocols used: Blood Pressure - High-A-AH

## 2023-07-12 ENCOUNTER — Ambulatory Visit: Payer: Self-pay | Admitting: Family Medicine

## 2023-07-12 ENCOUNTER — Emergency Department (HOSPITAL_COMMUNITY)
Admission: EM | Admit: 2023-07-12 | Discharge: 2023-07-12 | Discharge status: Against Medical Advice | Payer: BC Managed Care – PPO | Attending: Emergency Medicine | Admitting: Emergency Medicine

## 2023-07-12 ENCOUNTER — Encounter (HOSPITAL_COMMUNITY): Payer: Self-pay

## 2023-07-12 ENCOUNTER — Emergency Department (HOSPITAL_COMMUNITY): Payer: BC Managed Care – PPO

## 2023-07-12 ENCOUNTER — Other Ambulatory Visit: Payer: Self-pay

## 2023-07-12 DIAGNOSIS — R0602 Shortness of breath: Secondary | ICD-10-CM | POA: Insufficient documentation

## 2023-07-12 DIAGNOSIS — Z5321 Procedure and treatment not carried out due to patient leaving prior to being seen by health care provider: Secondary | ICD-10-CM | POA: Diagnosis not present

## 2023-07-12 DIAGNOSIS — R519 Headache, unspecified: Secondary | ICD-10-CM | POA: Diagnosis not present

## 2023-07-12 DIAGNOSIS — I10 Essential (primary) hypertension: Secondary | ICD-10-CM | POA: Diagnosis not present

## 2023-07-12 DIAGNOSIS — R079 Chest pain, unspecified: Secondary | ICD-10-CM | POA: Insufficient documentation

## 2023-07-12 DIAGNOSIS — R0789 Other chest pain: Secondary | ICD-10-CM | POA: Diagnosis not present

## 2023-07-12 LAB — CBC WITH DIFFERENTIAL/PLATELET
Abs Immature Granulocytes: 0.02 10*3/uL (ref 0.00–0.07)
Basophils Absolute: 0.1 10*3/uL (ref 0.0–0.1)
Basophils Relative: 1 %
Eosinophils Absolute: 0.2 10*3/uL (ref 0.0–0.5)
Eosinophils Relative: 3 %
HCT: 40.4 % (ref 36.0–46.0)
Hemoglobin: 13.6 g/dL (ref 12.0–15.0)
Immature Granulocytes: 0 %
Lymphocytes Relative: 30 %
Lymphs Abs: 1.8 10*3/uL (ref 0.7–4.0)
MCH: 28.6 pg (ref 26.0–34.0)
MCHC: 33.7 g/dL (ref 30.0–36.0)
MCV: 85.1 fL (ref 80.0–100.0)
Monocytes Absolute: 0.6 10*3/uL (ref 0.1–1.0)
Monocytes Relative: 10 %
Neutro Abs: 3.5 10*3/uL (ref 1.7–7.7)
Neutrophils Relative %: 56 %
Platelets: 338 10*3/uL (ref 150–400)
RBC: 4.75 MIL/uL (ref 3.87–5.11)
RDW: 12.4 % (ref 11.5–15.5)
WBC: 6.2 10*3/uL (ref 4.0–10.5)
nRBC: 0 % (ref 0.0–0.2)

## 2023-07-12 LAB — BASIC METABOLIC PANEL
Anion gap: 8 (ref 5–15)
BUN: 15 mg/dL (ref 6–20)
CO2: 25 mmol/L (ref 22–32)
Calcium: 9.2 mg/dL (ref 8.9–10.3)
Chloride: 104 mmol/L (ref 98–111)
Creatinine, Ser: 0.71 mg/dL (ref 0.44–1.00)
GFR, Estimated: >60 mL/min (ref >60)
Glucose, Bld: 83 mg/dL (ref 70–99)
Potassium: 3.7 mmol/L (ref 3.5–5.1)
Sodium: 137 mmol/L (ref 135–145)

## 2023-07-12 LAB — TROPONIN I (HIGH SENSITIVITY): Troponin I (High Sensitivity): <2 ng/L (ref <18)

## 2023-07-12 LAB — HCG, QUANTITATIVE, PREGNANCY: hCG, Beta Chain, Quant, S: <1 m[IU]/mL (ref <5)

## 2023-07-12 NOTE — ED Triage Notes (Signed)
Pt arrived via POV c/o hypertension, and chest pain, SOB that began yesterday. Pt reports she tried going to see her PCP but Pt reports they would not see her and advised she seek treatment in the ER. Pt also endorses a headache.

## 2023-07-12 NOTE — Telephone Encounter (Signed)
FYI

## 2023-07-12 NOTE — ED Notes (Signed)
Called fro Pt from waiting room . No answer

## 2023-07-12 NOTE — ED Provider Triage Note (Signed)
Emergency Medicine Provider Triage Evaluation Note  Zoe Evans , a 38 y.o. female  was evaluated in triage.  Pt complains of chest pain, elevated blood pressure.  Patient notes that her pressures have been running approximately 140/110 at home.  She is currently on 2 blood pressure medications.  Review of Systems  Positive: Chest pain and elevated blood pressure Negative: Dyspnea, abdominal pain, nausea, vomiting, diarrhea  Physical Exam  BP (!) 139/106   Pulse 84   Resp 18   Ht 5\' 3"  (1.6 m)   Wt 90.4 kg   LMP 07/05/2023 (Exact Date)   SpO2 100%   BMI 35.30 kg/m  Gen:   Awake, no distress   Resp:  Normal effort  MSK:   Moves extremities without difficulty  Other:    Medical Decision Making  Medically screening exam initiated at 1:50 PM.  Appropriate orders placed.  Zoe Evans was informed that the remainder of the evaluation will be completed by another provider, this initial triage assessment does not replace that evaluation, and the importance of remaining in the ED until their evaluation is complete.  Labs and imaging have been ordered.  Waiting for bed in the back at this time.   Lelon Perla, PA-C 07/12/23 1350

## 2023-07-12 NOTE — ED Notes (Signed)
Called for Pt from waiting room X 3. No answer

## 2023-07-12 NOTE — Telephone Encounter (Signed)
Copied from CRM [id number]. Topic: Clinical - Red Word Triage >> Jul 12, 2023 10:45 AM Ivette P wrote: [facility] that prompted transfer to Nurse Triage: Blood Pressure spiking 140/110   Chief Complaint: elevated BPs Symptoms: elevated BPs, chest pain, SOB, "really bad" headache not relieved by OTC meds Frequency: continual, consistent Pertinent Negatives: Patient denies struggling to breathe, severe pain Disposition: [] 911 / [] ED /[] Urgent Care (no appt availability in office) / [] Appointment(In office/virtual)/ []  [facility]/ [] Home Care/ [x] Refused Recommended Disposition /[] [facility]/ []  Follow-up with PCP Additional Notes: Pt reporting that she is having elevated BP's and has been for past month, called yesterday about BP, chest pain, and SOB "because wanted to get in to talk to doc," that nurse had advised ED for chest pain and SOB yesterday "but didn't get a chance to with son and daughter, but has gone down some" with symptoms. BP reading today are 140/110 then 5 min later at 10:49 am 146/99. Pt reporting "some chest pain and SOB but not as much as yesterday, chest is sore today, not so much the same tightness," also experiencing "really bad headache, can't tell if allergies or if blood pressure, took tylenol at 7:30 am and still pretty significant." Pt confirms no missed doses of HTN meds. Pt reporting "chest pain yesterday was an hour, tightness, chest pain today sore when I breathe every time like especially real deep breath," pt denies lasting over 5 min. Advised pt head to ED immediately. Pt then confirms chest pain "kinda consistent like ribs and breasts are sore to touch," confirms "yesterday more like" the crushing/pressure/heavy feeling but today just "sore." Advised pt call 911, offered to call for her, pt declines, stating she will head to the ED, will have someone watch the kids and has someone to drive her. Advised pt call 911 for any worsening. Pt verbalized  understanding.  Reason for Disposition  [1] Chest pain lasts > 5 minutes AND [2] age > 30 AND [3] one or more cardiac risk factors (e.g., diabetes, high blood pressure, high cholesterol, smoker, or strong family history of heart disease)  Answer Assessment - Initial Assessment Questions 1. BLOOD PRESSURE: "What is the blood pressure?" "Did you take at least two measurements 5 minutes apart?"     140/110, taking again now 5 min apart, 146/99 at 10:49 am 2. ONSET: "When did you take your blood pressure?"     A month ago 3. HOW: "How did you take your blood pressure?" (e.g., automatic home BP monitor, visiting nurse)     Home BP cuff 4. HISTORY: "Do you have a history of high blood pressure?"     Yes 5. MEDICINES: "Are you taking any medicines for blood pressure?" "Have you missed any doses recently?"     takes 2 meds for it amlodipine and losartan, no missed doses 6. OTHER SYMPTOMS: "Do you have any symptoms?" (e.g., blurred vision, chest pain, difficulty breathing, headache, weakness)     Yesterday called because wanted to get in to talk to doc, advised ED for chest pain and SOB yesterday but didn't get a chance to with son and daughter but has gone down some. Some chest pain and SOB but not as much as yesterday, chest is sore today, not so much the same tightness. Really bad headache, can't tell if allergies if blood pressure, took tylenol at 7:30 am and still pretty significant 7. PREGNANCY: "Is there any chance you are pregnant?" "When was your last menstrual period?"  denies  Answer Assessment - Initial Assessment Questions 1. LOCATION: "Where does it hurt?"       Sore today, like ribs and breasts are sore to touch 3. ONSET: "When did the chest pain begin?" (Minutes, hours or days)      yesterday 4. PATTERN: "Does the pain come and go, or has it been constant since it started?"  "Does it get worse with exertion?"      Sore when I breathe, every time, like especially real deep breath,  Kinda consistent 5. DURATION: "How long does it last" (e.g., seconds, minutes, hours)     Yesterday was an hour, tightness, yesterday more like the pressure/heavy/crushing type, today just sore 7. CARDIAC RISK FACTORS: "Do you have any history of heart problems or risk factors for heart disease?" (e.g., angina, prior heart attack; diabetes, high blood pressure, high cholesterol, smoker, or strong family history of heart disease)     Hx HTN 8. PULMONARY RISK FACTORS: "Do you have any history of lung disease?"  (e.g., blood clots in lung, asthma, emphysema, birth control pills)     no 10. OTHER SYMPTOMS: "Do you have any other symptoms?" (e.g., dizziness, nausea, vomiting, sweating, fever, difficulty breathing, cough)       SOB not as bad as yesterday, elevated Bps, really bad headache 11. PREGNANCY: "Is there any chance you are pregnant?" "When was your last menstrual period?"       denies  Protocols used: Blood Pressure - High-A-AH, Chest Pain-A-AH

## 2023-08-01 ENCOUNTER — Other Ambulatory Visit: Payer: Self-pay | Admitting: Family Medicine

## 2023-08-01 DIAGNOSIS — I10 Essential (primary) hypertension: Secondary | ICD-10-CM

## 2023-09-02 ENCOUNTER — Other Ambulatory Visit: Payer: Self-pay | Admitting: Family Medicine

## 2023-09-02 DIAGNOSIS — F418 Other specified anxiety disorders: Secondary | ICD-10-CM

## 2023-10-22 ENCOUNTER — Other Ambulatory Visit: Payer: Self-pay | Admitting: Family Medicine

## 2023-10-22 DIAGNOSIS — I10 Essential (primary) hypertension: Secondary | ICD-10-CM

## 2023-11-03 ENCOUNTER — Other Ambulatory Visit: Payer: Self-pay | Admitting: Family Medicine

## 2023-11-03 DIAGNOSIS — I10 Essential (primary) hypertension: Secondary | ICD-10-CM

## 2023-11-05 ENCOUNTER — Other Ambulatory Visit: Payer: Self-pay | Admitting: Family Medicine

## 2023-11-05 DIAGNOSIS — I10 Essential (primary) hypertension: Secondary | ICD-10-CM

## 2023-11-06 ENCOUNTER — Other Ambulatory Visit: Payer: Self-pay | Admitting: Family Medicine

## 2023-11-06 DIAGNOSIS — I10 Essential (primary) hypertension: Secondary | ICD-10-CM

## 2023-11-08 ENCOUNTER — Encounter: Payer: Self-pay | Admitting: Family Medicine

## 2023-11-08 ENCOUNTER — Ambulatory Visit (INDEPENDENT_AMBULATORY_CARE_PROVIDER_SITE_OTHER): Admitting: Family Medicine

## 2023-11-08 VITALS — BP 121/81 | HR 77 | Temp 98.2°F | Wt 194.0 lb

## 2023-11-08 DIAGNOSIS — F418 Other specified anxiety disorders: Secondary | ICD-10-CM | POA: Diagnosis not present

## 2023-11-08 DIAGNOSIS — R1031 Right lower quadrant pain: Secondary | ICD-10-CM

## 2023-11-08 DIAGNOSIS — I10 Essential (primary) hypertension: Secondary | ICD-10-CM | POA: Diagnosis not present

## 2023-11-08 MED ORDER — CLONAZEPAM 0.5 MG PO TABS: 0.5000 mg | ORAL_TABLET | Freq: Two times a day (BID) | ORAL | 1 refills | Status: AC | PRN

## 2023-11-08 MED ORDER — DICLOFENAC SODIUM 75 MG PO TBEC: 75.0000 mg | DELAYED_RELEASE_TABLET | Freq: Two times a day (BID) | ORAL | 1 refills | Status: DC | PRN

## 2023-11-08 MED ORDER — ARIPIPRAZOLE 2 MG PO TABS: 2.0000 mg | ORAL_TABLET | Freq: Every day | ORAL | 0 refills | Status: DC

## 2023-11-08 NOTE — Patient Instructions (Signed)
 Labs ordered.  CT ordered.  Abilify added.  Follow up in 6 weeks.

## 2023-11-09 DIAGNOSIS — R1031 Right lower quadrant pain: Secondary | ICD-10-CM | POA: Insufficient documentation

## 2023-11-09 NOTE — Assessment & Plan Note (Signed)
 Stable.  Continue current medications.

## 2023-11-09 NOTE — Progress Notes (Signed)
 Subjective:  Patient ID: Zoe Evans, female    DOB: 01-Jul-1985  Age: 38 y.o. MRN: [id number]  CC:   Chief Complaint  Patient presents with   Medication Refill    Patient in room #2 and alone. Patient states she here for an follow on her medications management.    HPI:  38 year old female presents for follow-up.  Patient's blood pressure is well-controlled.  Patient reports ongoing/chronic abdominal pain with associated constipation.  This is a longstanding problem.  Has a history of colitis.  Will discuss further evaluation today.  Patient also has significant depression and anxiety.  PHQ-9 score of 16.  GAD-7 score of 18.  Needs refill on clonazepam .  Currently on Effexor .  Will discuss additional treatment options to aid in her symptoms.  Patient Active Problem List   Diagnosis Date Noted   Right lower quadrant abdominal pain 11/09/2023   Other fatigue 09/23/2022   Iron  deficiency 09/23/2022   Myalgia 09/23/2022   Arthralgia 09/23/2022   H/O herpes genitalis 01/15/2021   Essential hypertension 04/05/2016   Migraine headache 08/09/2014   Depression with anxiety 07/01/2013    Social Hx   Social History   Socioeconomic History   Marital status: Married    Spouse name: Not on file   Number of children: Not on file   Years of education: Not on file   Highest education level: Not on file  Occupational History   Not on file  Tobacco Use   Smoking status: Never   Smokeless tobacco: Never  Vaping Use   Vaping status: Never Used  Substance and Sexual Activity   Alcohol use: Not Currently    Comment: occasional   Drug use: Never   Sexual activity: Yes    Birth control/protection: Surgical  Other Topics Concern   Not on file  Social History Narrative   ** Merged History Encounter **       Social Drivers of Health   Financial Resource Strain: Low Risk  (02/15/2018)   Overall Financial Resource Strain (CARDIA)    Difficulty of Paying Living Expenses: Not  hard at all  Food Insecurity: No Food Insecurity (02/15/2018)   Hunger Vital Sign    Worried About Running Out of Food in the Last Year: Never true    Ran Out of Food in the Last Year: Never true  Transportation Needs: Unknown (02/15/2018)   PRAPARE - Administrator, Civil Service (Medical): No    Lack of Transportation (Non-Medical): Not on file  Physical Activity: Not on file  Stress: Stress Concern Present (02/15/2018)   [employer] of Occupational Health - Occupational Stress Questionnaire    Feeling of Stress : To some extent  Social Connections: Not on file    Review of Systems Per HPI  Objective:  BP 121/81 (BP Location: Left Arm, Patient Position: Sitting, Cuff Size: Normal)   Pulse 77   Temp 98.2 F (36.8 C)   Wt 194 lb (88 kg)   SpO2 98%   BMI 34.37 kg/m      11/08/2023    3:30 PM 07/12/2023    1:12 PM 07/12/2023    1:11 PM  BP/Weight  Systolic BP 121  409  Diastolic BP 81  106  Wt. (Lbs) 194 199.3   BMI 34.37 kg/m2 35.3 kg/m2     Physical Exam Vitals and nursing note reviewed.  Constitutional:      General: She is not in acute distress.    Appearance: Normal  appearance.  HENT:     Head: Normocephalic and atraumatic.  Cardiovascular:     Rate and Rhythm: Normal rate and regular rhythm.  Pulmonary:     Effort: Pulmonary effort is normal.     Breath sounds: Normal breath sounds. No wheezing, rhonchi or rales.  Abdominal:     General: There is no distension.     Palpations: Abdomen is soft.     Tenderness: There is no abdominal tenderness.  Neurological:     Mental Status: She is alert.     Lab Results  Component Value Date   WBC 6.2 07/12/2023   HGB 13.6 07/12/2023   HCT 40.4 07/12/2023   PLT 338 07/12/2023   GLUCOSE 83 07/12/2023   CHOL 184 12/16/2015   TRIG 75 12/16/2015   HDL 58 12/16/2015   LDLCALC 111 (H) 12/16/2015   ALT 26 09/24/2022   AST 27 09/24/2022   NA 137 07/12/2023   K 3.7 07/12/2023   CL 104 07/12/2023    CREATININE 0.71 07/12/2023   BUN 15 07/12/2023   CO2 25 07/12/2023   TSH 1.030 09/24/2022   HGBA1C 5.3 04/04/2015     Assessment & Plan:  Essential hypertension Assessment & Plan: Stable.  Continue current medications.   Right lower quadrant abdominal pain Assessment & Plan: Ongoing chronic abdominal pain.  CT scan and labs for further evaluation.  Has a history of colitis.  Orders: -     CBC -     CMP14+EGFR -     Lipase -     Celiac Ab tTG DGP TIgA -     Alpha-Gal Panel -     CT ABDOMEN PELVIS W CONTRAST  Depression with anxiety Assessment & Plan: Uncontrolled.  Adding Abilify.   Other orders -     ARIPiprazole; Take 1 tablet (2 mg total) by mouth daily.  Dispense: 90 tablet; Refill: 0 -     clonazePAM ; Take 1 tablet (0.5 mg total) by mouth 2 (two) times daily as needed. for anxiety  Dispense: 60 tablet; Refill: 1 -     Diclofenac  Sodium; Take 1 tablet (75 mg total) by mouth 2 (two) times daily as needed for mild pain (pain score 1-3) or moderate pain (pain score 4-6).  Dispense: 60 tablet; Refill: 1    Follow-up: Pending labs/workup.  Kathleen Papa DO [facility]

## 2023-11-09 NOTE — Assessment & Plan Note (Signed)
 Uncontrolled.  Adding Abilify.

## 2023-11-09 NOTE — Assessment & Plan Note (Signed)
 Ongoing chronic abdominal pain.  CT scan and labs for further evaluation.  Has a history of colitis.

## 2023-11-18 DIAGNOSIS — R1031 Right lower quadrant pain: Secondary | ICD-10-CM | POA: Diagnosis not present

## 2023-11-20 ENCOUNTER — Ambulatory Visit: Payer: Self-pay | Admitting: Family Medicine

## 2023-11-22 LAB — ALPHA-GAL PANEL: IgE (Immunoglobulin E), Serum: 7 [IU]/mL (ref 6–495)

## 2023-11-22 LAB — CBC
Hematocrit: 39.8 % (ref 34.0–46.6)
Hemoglobin: 13.4 g/dL (ref 11.1–15.9)
MCH: 29.6 pg (ref 26.6–33.0)
MCHC: 33.7 g/dL (ref 31.5–35.7)
MCV: 88 fL (ref 79–97)
Platelets: 349 10*3/uL (ref 150–450)
RBC: 4.53 x10E6/uL (ref 3.77–5.28)
RDW: 12.1 % (ref 11.7–15.4)
WBC: 7.8 10*3/uL (ref 3.4–10.8)

## 2023-11-22 LAB — CMP14+EGFR
ALT: 13 IU/L (ref 0–32)
AST: 18 IU/L (ref 0–40)
Albumin: 4.4 g/dL (ref 3.9–4.9)
Alkaline Phosphatase: 62 IU/L (ref 44–121)
BUN/Creatinine Ratio: 16 (ref 9–23)
BUN: 13 mg/dL (ref 6–20)
Bilirubin Total: 0.3 mg/dL (ref 0.0–1.2)
CO2: 20 mmol/L (ref 20–29)
Calcium: 9.2 mg/dL (ref 8.7–10.2)
Chloride: 103 mmol/L (ref 96–106)
Creatinine, Ser: 0.81 mg/dL (ref 0.57–1.00)
Globulin, Total: 2.2 g/dL (ref 1.5–4.5)
Glucose: 92 mg/dL (ref 70–99)
Potassium: 4.2 mmol/L (ref 3.5–5.2)
Sodium: 139 mmol/L (ref 134–144)
Total Protein: 6.6 g/dL (ref 6.0–8.5)
eGFR: 95 mL/min/{1.73_m2} (ref >59)

## 2023-11-22 LAB — CELIAC AB TTG DGP TIGA
Antigliadin Abs, IgA: 5 U (ref 0–19)
Gliadin IgG: 2 U (ref 0–19)
IgA/Immunoglobulin A, Serum: 226 mg/dL (ref 87–352)
Tissue Transglut Ab: 3 U/mL (ref 0–5)

## 2023-11-22 LAB — LIPASE: Lipase: 62 U/L (ref 14–72)

## 2023-11-23 ENCOUNTER — Telehealth: Payer: Self-pay

## 2023-11-23 NOTE — Telephone Encounter (Signed)
 Communication  Reason for CRM: bonnie from [facility] called in because pt has an appt with them on 11/25/2023 and in order for the patient to have the appt they need to have a prior auth in their chart . They said she needs to have one by 10am tomorrow 11/24/2023 if possible

## 2023-11-23 NOTE — Telephone Encounter (Signed)
 Per referral coordinator the PA is in the chart and attached to order- GI notified

## 2023-11-23 NOTE — Telephone Encounter (Signed)
Message sent to referral coordinator

## 2023-11-25 ENCOUNTER — Ambulatory Visit
Admission: RE | Admit: 2023-11-25 | Discharge: 2023-11-25 | Disposition: A | Discharge status: Home / Self Care | Source: Ambulatory Visit | Attending: Family Medicine | Admitting: Family Medicine

## 2023-11-25 DIAGNOSIS — R1031 Right lower quadrant pain: Secondary | ICD-10-CM | POA: Diagnosis not present

## 2023-11-25 DIAGNOSIS — G8929 Other chronic pain: Secondary | ICD-10-CM | POA: Diagnosis not present

## 2023-11-25 MED ORDER — IOPAMIDOL (ISOVUE-300) INJECTION 61%
100.0000 mL | Freq: Once | INTRAVENOUS | Status: AC | PRN
  Administered 2023-11-25: 100 mL via INTRAVENOUS

## 2023-12-01 ENCOUNTER — Other Ambulatory Visit (HOSPITAL_COMMUNITY): Payer: Self-pay

## 2023-12-01 ENCOUNTER — Encounter: Payer: Self-pay | Admitting: Family Medicine

## 2023-12-01 ENCOUNTER — Other Ambulatory Visit: Payer: Self-pay | Admitting: Family Medicine

## 2023-12-01 ENCOUNTER — Telehealth: Payer: Self-pay | Admitting: Pharmacy Technician

## 2023-12-01 MED ORDER — TRULANCE 3 MG PO TABS: 3.0000 mg | ORAL_TABLET | Freq: Every day | ORAL | 3 refills | Status: AC

## 2023-12-01 MED ORDER — LINACLOTIDE 145 MCG PO CAPS: 145.0000 ug | ORAL_CAPSULE | Freq: Every day | ORAL | 3 refills | Status: DC

## 2023-12-01 NOTE — Telephone Encounter (Signed)
 Pharmacy Patient Advocate Encounter   Received notification from CoverMyMeds that prior authorization for Linzess 145MCG capsules is required/requested.   Insurance verification completed.   The patient is insured through [facility] .   Per test claim:  TRULANCE is preferred by the insurance.  If suggested medication is appropriate, Please send in a new RX and discontinue this one. If not, please advise as to why it's not appropriate so that we may request a Prior Authorization. Please note, some preferred medications may still require a PA.  If the suggested medications have not been trialed and there are no contraindications to their use, the PA will not be submitted, as it will not be approved.

## 2023-12-05 ENCOUNTER — Telehealth: Payer: Self-pay | Admitting: Pharmacy Technician

## 2023-12-05 ENCOUNTER — Other Ambulatory Visit (HOSPITAL_COMMUNITY): Payer: Self-pay

## 2023-12-05 NOTE — Telephone Encounter (Signed)
 Pharmacy Patient Advocate Encounter   Received notification from CoverMyMeds that prior authorization for Trulance  3MG  tablets is required/requested.   Insurance verification completed.   The patient is insured through [facility] .   Per test claim: PA required; PA submitted to above mentioned insurance via CoverMyMeds Key/confirmation #/EOC [id number] Status is pending

## 2023-12-05 NOTE — Telephone Encounter (Signed)
 Therapy changed to Trulance .

## 2023-12-05 NOTE — Telephone Encounter (Signed)
 Pharmacy Patient Advocate Encounter  Received notification from [facility] that Prior Authorization for Trulance  3MG  tablets has been APPROVED from 12/05/2023 to 12/04/2024. Ran test claim, Copay is $25.00. This test claim was processed through [facility]- copay amounts may vary at other pharmacies due to pharmacy/plan contracts, or as the patient moves through the different stages of their insurance plan.   PA #/Case ID/Reference #: [id number]

## 2023-12-14 ENCOUNTER — Other Ambulatory Visit: Payer: Self-pay | Admitting: Family Medicine

## 2023-12-14 MED ORDER — ARIPIPRAZOLE 5 MG PO TABS: 5.0000 mg | ORAL_TABLET | Freq: Every day | ORAL | 1 refills | Status: DC

## 2023-12-25 ENCOUNTER — Other Ambulatory Visit: Payer: Self-pay | Admitting: Family Medicine

## 2023-12-25 DIAGNOSIS — F418 Other specified anxiety disorders: Secondary | ICD-10-CM

## 2024-01-16 ENCOUNTER — Other Ambulatory Visit: Payer: Self-pay | Admitting: Family Medicine

## 2024-01-16 DIAGNOSIS — F418 Other specified anxiety disorders: Secondary | ICD-10-CM

## 2024-01-16 NOTE — Telephone Encounter (Signed)
 Copied from CRM #[id number]. Topic: Clinical - Medication Refill >> Jan 16, 2024  9:28 AM [name] wrote: Patient recently moved and misplaced this medication below hoping to get another refill please.   Medication: venlafaxine  XR (EFFEXOR -XR) 75 MG 24 hr capsule  Has the patient contacted their pharmacy? Yes (Agent: If no, request that the patient contact the pharmacy for the refill. If patient does not wish to contact the pharmacy document the reason why and proceed with request.) (Agent: If yes, when and what did the pharmacy advise?)  This is the patient's preferred pharmacy:  [facility] [address], [place] - [address] [place] [address] Phone: [phone] Fax: [phone]  Is this the correct pharmacy for this prescription? Yes If no, delete pharmacy and type the correct one.   Has the prescription been filled recently? Yes  Is the patient out of the medication? Yes  Has the patient been seen for an appointment in the last year OR does the patient have an upcoming appointment? Yes  Can we respond through MyChart? Yes  Agent: Please be advised that Rx refills may take up to 3 business days. We ask that you follow-up with your pharmacy.

## 2024-01-17 ENCOUNTER — Other Ambulatory Visit: Payer: Self-pay | Admitting: Family Medicine

## 2024-01-18 ENCOUNTER — Telehealth: Payer: Self-pay

## 2024-01-18 ENCOUNTER — Encounter: Payer: Self-pay | Admitting: Family Medicine

## 2024-01-18 ENCOUNTER — Other Ambulatory Visit: Payer: Self-pay

## 2024-01-18 ENCOUNTER — Ambulatory Visit: Payer: Self-pay

## 2024-01-18 DIAGNOSIS — F418 Other specified anxiety disorders: Secondary | ICD-10-CM

## 2024-01-18 NOTE — Telephone Encounter (Signed)
 FYI Only or Action Required?: Action required by provider: medication refill request.  Patient was last seen in primary care on 11/08/2023 by Cook, Jayce G, DO.  Called Nurse Triage reporting Headache.  Symptoms began yesterday.  Interventions attempted: OTC medications: Claritin and ibuprofen  .  Symptoms are: gradually worsening.  Triage Disposition: Home Care  Patient/caregiver understands and will follow disposition?: YesCopied from CRM #[id number]. Topic: Clinical - Red Word Triage >> Jan 18, 2024 11:20 AM Montie POUR wrote: Red Word that prompted transfer to Nurse Triage:  She is having withdrawals form venlafaxine  XR (EFFEXOR -XR) 75 MG 24 hr capsule. She has been calling to get refills since 01/16/24 and she was out of the medication.  Symptoms: sweats, headache, nauseated Reason for Disposition  Headache  Answer Assessment - Initial Assessment Questions Pt called in refill for Venlafaxine  on Monday. Pt took last pill Sunday. Symptoms started yesterday and have worsened. Pt has taken Claritin and ibuprofen . Pt is at work and pushing through. Pt wants this handled without having to come into office for script. No appt made.  CAL notified.       1. LOCATION: Where does it hurt?      forehead 2. ONSET: When did the headache start? (e.g., minutes, hours, days)      yesterday 3. PATTERN: Does the pain come and go, or has it been constant since it started?     Constant  4. SEVERITY: How bad is the pain? and What does it keep you from doing?  (e.g., Scale 1-10; mild, moderate, or severe)     6 5. RECURRENT SYMPTOM: Have you ever had headaches before? If Yes, ask: When was the last time? and What happened that time?      denies 6. CAUSE: What do you think is causing the headache?     Been without Venlafaxine  for 3 days 7. MIGRAINE: Have you been diagnosed with migraine headaches? If Yes, ask: Is this headache similar?      na 8. HEAD INJURY: Has there been  any recent injury to your head?      na 9. OTHER SYMPTOMS: Do you have any other symptoms? (e.g., fever, stiff neck, eye pain, sore throat, cold symptoms)     Vomiting, tunnel vision, sweaty  Protocols used: Headache-A-AH

## 2024-01-18 NOTE — Telephone Encounter (Signed)
 [facility] pharmacy called about Venlafaxine  refill. [facility] refilled 90 days of medication on 12/26/23. Instructions to take one a day with breakfast.  Also called patient, left voicemail. See other telephone encounter.

## 2024-01-18 NOTE — Telephone Encounter (Signed)
 Called patient about Venlafaxine  refill. Phone went to voicemail left message of being filled for 90 days on 7/28. With instructions to take one a day. Instructed to call back if has been taking more than one a day.

## 2024-01-19 ENCOUNTER — Other Ambulatory Visit: Payer: Self-pay | Admitting: Family Medicine

## 2024-01-19 DIAGNOSIS — F418 Other specified anxiety disorders: Secondary | ICD-10-CM

## 2024-01-19 MED ORDER — VENLAFAXINE HCL ER 75 MG PO CP24
75.0000 mg | ORAL_CAPSULE | Freq: Every day | ORAL | 0 refills | Status: DC
Start: 2024-01-19 — End: 2024-03-26

## 2024-01-20 ENCOUNTER — Ambulatory Visit: Admitting: Family Medicine

## 2024-02-02 ENCOUNTER — Other Ambulatory Visit: Payer: Self-pay | Admitting: Family Medicine

## 2024-02-02 DIAGNOSIS — I10 Essential (primary) hypertension: Secondary | ICD-10-CM

## 2024-02-03 ENCOUNTER — Other Ambulatory Visit: Payer: Self-pay | Admitting: Family Medicine

## 2024-02-03 DIAGNOSIS — I10 Essential (primary) hypertension: Secondary | ICD-10-CM

## 2024-03-24 ENCOUNTER — Other Ambulatory Visit: Payer: Self-pay | Admitting: Family Medicine

## 2024-03-24 DIAGNOSIS — F418 Other specified anxiety disorders: Secondary | ICD-10-CM

## 2024-03-27 ENCOUNTER — Encounter: Payer: Self-pay | Admitting: Family Medicine

## 2024-05-15 ENCOUNTER — Telehealth: Payer: Self-pay | Admitting: Pharmacist

## 2024-05-15 ENCOUNTER — Encounter: Payer: Self-pay | Admitting: Family Medicine

## 2024-05-15 ENCOUNTER — Ambulatory Visit: Admitting: Family Medicine

## 2024-05-15 VITALS — BP 125/88 | HR 99 | Temp 98.1°F | Ht 63.0 in | Wt 219.0 lb

## 2024-05-15 DIAGNOSIS — G43709 Chronic migraine without aura, not intractable, without status migrainosus: Secondary | ICD-10-CM

## 2024-05-15 MED ORDER — INDOMETHACIN ER 75 MG PO CPCR: 75.0000 mg | ORAL_CAPSULE | Freq: Two times a day (BID) | ORAL | 0 refills | Status: AC | PRN

## 2024-05-15 MED ORDER — SUMATRIPTAN SUCCINATE 50 MG PO TABS: 50.0000 mg | ORAL_TABLET | Freq: Once | ORAL | 0 refills | Status: AC

## 2024-05-15 NOTE — Assessment & Plan Note (Signed)
 Suspected migraine.  Indomethacin  as directed.  Imitrex  as needed.  Would like to try Nurtec but I do not think insurance will approve until she has failed therapy.  Will reach out to my pharmacist.

## 2024-05-15 NOTE — Patient Instructions (Signed)
 Medications sent in.  Message if no improvement.  Take care  Dr. Bluford

## 2024-05-15 NOTE — Progress Notes (Signed)
 Subjective:  Patient ID: Zoe Evans, female    DOB: 11-09-1985  Age: 38 y.o. MRN: [id number]  CC:   Chief Complaint  Patient presents with   Headache    Headache begins at the back side of the head leading up to left side head and causing eye twitching     HPI:  38 year old female presents for evaluation of the above.  1.5-week history of frequent and intermittent headaches.  Stated to the left occipital region and left temporal region.  Associated nausea.  Has remote history of migraine.  She describes the pain as a throbbing and burning sensation.  Lasts for minutes to hours she has used ibuprofen  and Tylenol  without improvement.  No associated vision changes.  Patient Active Problem List   Diagnosis Date Noted   Other fatigue 09/23/2022   Iron  deficiency 09/23/2022   H/O herpes genitalis 01/15/2021   Essential hypertension 04/05/2016   Migraine headache 08/09/2014   Depression with anxiety 07/01/2013    Social Hx   Social History   Socioeconomic History   Marital status: Married    Spouse name: Not on file   Number of children: Not on file   Years of education: Not on file   Highest education level: Not on file  Occupational History   Not on file  Tobacco Use   Smoking status: Never   Smokeless tobacco: Never  Vaping Use   Vaping status: Never Used  Substance and Sexual Activity   Alcohol use: Not Currently    Comment: occasional   Drug use: Never   Sexual activity: Yes    Birth control/protection: Surgical  Other Topics Concern   Not on file  Social History Narrative   ** Merged History Encounter **       Social Drivers of Health   Tobacco Use: Low Risk (05/15/2024)   Patient History    Smoking Tobacco Use: Never    Smokeless Tobacco Use: Never    Passive Exposure: Not on file  Financial Resource Strain: Not on file  Food Insecurity: Not on file  Transportation Needs: Not on file  Physical Activity: Not on file  Stress: Not on file  Social  Connections: Not on file  Depression (PHQ2-9): Low Risk (05/15/2024)   Depression (PHQ2-9)    PHQ-2 Score: 3  Alcohol Screen: Not on file  Housing: Not on file  Utilities: Not on file  Health Literacy: Not on file    Review of Systems Per HPI  Objective:  BP 125/88   Pulse 99   Temp 98.1 F (36.7 C)   Ht 5' 3 (1.6 m)   Wt 219 lb (99.3 kg)   SpO2 99%   BMI 38.79 kg/m      05/15/2024    9:04 AM 11/08/2023    3:30 PM 07/12/2023    1:12 PM  BP/Weight  Systolic BP 125 121   Diastolic BP 88 81   Wt. (Lbs) 219 194 199.3  BMI 38.79 kg/m2 34.37 kg/m2 35.3 kg/m2    Physical Exam Vitals and nursing note reviewed.  Constitutional:      General: She is not in acute distress.    Appearance: Normal appearance.  HENT:     Head: Normocephalic and atraumatic.     Nose: Nose normal.     Mouth/Throat:     Pharynx: Oropharynx is clear.  Eyes:     General:        Right eye: No discharge.  Left eye: No discharge.     Conjunctiva/sclera: Conjunctivae normal.     Pupils: Pupils are equal, round, and reactive to light.  Cardiovascular:     Rate and Rhythm: Normal rate and regular rhythm.  Pulmonary:     Effort: Pulmonary effort is normal.     Breath sounds: Normal breath sounds. No wheezing, rhonchi or rales.  Neurological:     General: No focal deficit present.     Mental Status: She is alert.  Psychiatric:     Comments: Flat affect.     Lab Results  Component Value Date   WBC 7.8 11/18/2023   HGB 13.4 11/18/2023   HCT 39.8 11/18/2023   PLT 349 11/18/2023   GLUCOSE 92 11/18/2023   CHOL 184 12/16/2015   TRIG 75 12/16/2015   HDL 58 12/16/2015   LDLCALC 111 (H) 12/16/2015   ALT 13 11/18/2023   AST 18 11/18/2023   NA 139 11/18/2023   K 4.2 11/18/2023   CL 103 11/18/2023   CREATININE 0.81 11/18/2023   BUN 13 11/18/2023   CO2 20 11/18/2023   TSH 1.030 09/24/2022   HGBA1C 5.3 04/04/2015     Assessment & Plan:  Chronic migraine without aura without status  migrainosus, not intractable Assessment & Plan: Suspected migraine.  Indomethacin  as directed.  Imitrex  as needed.  Would like to try Nurtec but I do not think insurance will approve until she has failed therapy.  Will reach out to my pharmacist.  Orders: -     Indomethacin  ER; Take 1 capsule (75 mg total) by mouth 2 (two) times daily as needed for moderate pain (pain score 4-6) (Headache).  Dispense: 30 capsule; Refill: 0 -     SUMAtriptan  Succinate; Take 1 tablet (50 mg total) by mouth once. At the onset of headache. May repeat in 2 hours if headache persists or recurs.  Dispense: 10 tablet; Refill: 0    Follow-up:  Return if symptoms worsen or fail to improve.  Jacqulyn Ahle DO [facility]

## 2024-05-15 NOTE — Telephone Encounter (Signed)
 Coverage Criteria for Nurtec ODT 75 mg For Acute Treatment of Migraine:  Diagnosis of migraine for acute treatment. Patient is >= 39 years old. Documented treatment failure or contraindication with two generic triptan medications (e.g., sumatriptan , rizatriptan, zolmitriptan).  For Preventive Treatment of Migraine:  Diagnosis of migraine headaches for prevention. Patient is >= 70 years old. Documented trial of two medications from two different classes for migraine prevention (e.g., beta-blockers, anticonvulsants, antidepressants). Nurtec should not be used in combination with other CGRP antagonists for migraine prevention.  Assessment for This Patient  Meets age requirement (>= 27 years old). Indication: Acute treatment of migraine. Has tried and failed one triptan (sumatriptan ); however, coverage requires failure or contraindication with two generic triptans.  Conclusion: Based on current criteria, prior authorization cannot be approved yet for acute treatment unless documentation of failure or contraindication with a second generic triptan is provided. Could state triptans are contraindicated in the setting of hypertension  Mliss Tarry Griffin, PharmD, BCACP, CPP Clinical Pharmacist, [facility]

## 2024-05-19 ENCOUNTER — Other Ambulatory Visit: Payer: Self-pay | Admitting: Family Medicine
